# Patient Record
Sex: Female | Born: 1935 | Race: White | Hispanic: No | Smoking: Former smoker
Health system: Southern US, Community
[De-identification: ages and names within clinical notes are randomized; demographics above are authoritative.]

## PROBLEM LIST (undated history)

## (undated) DIAGNOSIS — N189 Chronic kidney disease, unspecified: Secondary | ICD-10-CM

## (undated) DIAGNOSIS — I10 Essential (primary) hypertension: Secondary | ICD-10-CM

## (undated) DIAGNOSIS — E119 Type 2 diabetes mellitus without complications: Secondary | ICD-10-CM

## (undated) DIAGNOSIS — I4891 Unspecified atrial fibrillation: Secondary | ICD-10-CM

## (undated) HISTORY — PX: TONSILLECTOMY: SUR1361

## (undated) HISTORY — PX: OTHER SURGICAL HISTORY: SHX169

---

## 2000-04-17 ENCOUNTER — Encounter: Payer: Self-pay | Admitting: Family Medicine

## 2000-04-17 ENCOUNTER — Encounter: Admission: RE | Admit: 2000-04-17 | Discharge: 2000-04-17 | Payer: Self-pay | Admitting: Family Medicine

## 2000-05-14 ENCOUNTER — Encounter: Admission: RE | Admit: 2000-05-14 | Discharge: 2000-08-12 | Payer: Self-pay | Admitting: Family Medicine

## 2005-03-23 ENCOUNTER — Encounter: Admission: RE | Admit: 2005-03-23 | Discharge: 2005-03-23 | Payer: Self-pay | Admitting: Family Medicine

## 2007-11-03 ENCOUNTER — Encounter: Admission: RE | Admit: 2007-11-03 | Discharge: 2007-11-03 | Payer: Self-pay | Admitting: Family Medicine

## 2007-11-03 IMAGING — US US CAROTID DUPLEX BILAT
1 series · 14 of 24 positions shown · non-contrast
Comparison: none

CLINICAL DATA: Unsteady gait.
 BILATERAL CAROTID DUPLEX DOPPLER ULTRASOUND:

[Series 1: us carotid duplex bilat · 0.08mm/px · 14 of 44 slices shown]
[im 1/44]
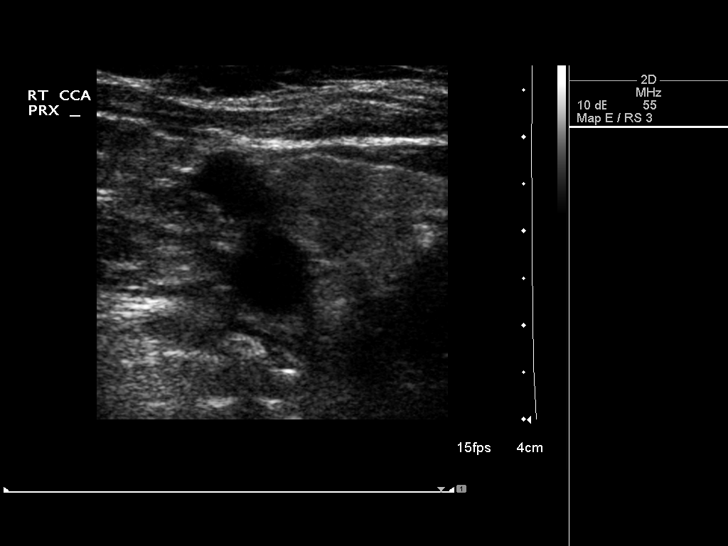
[im 4/44]
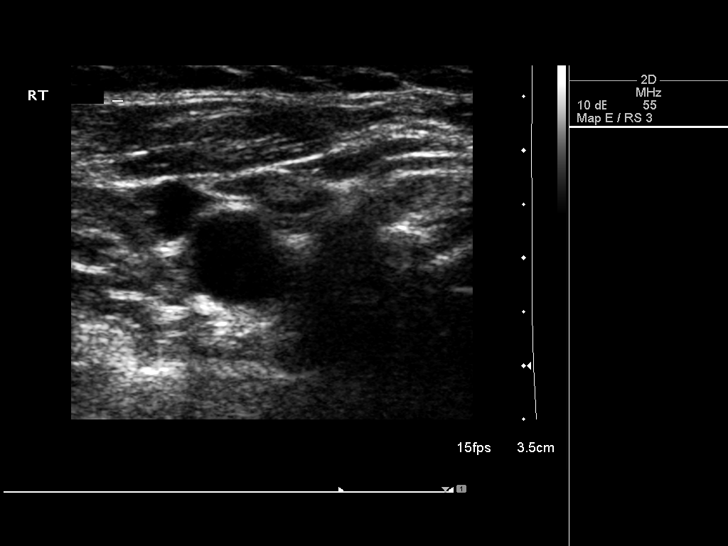
[im 8/44]
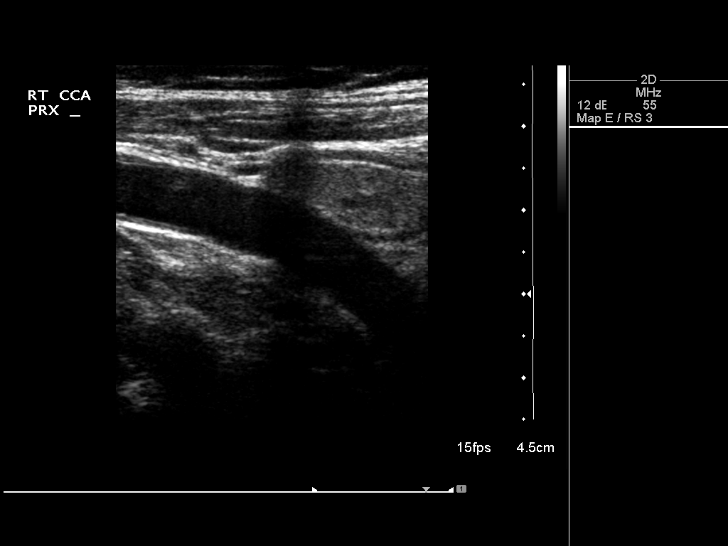
[im 12/44]
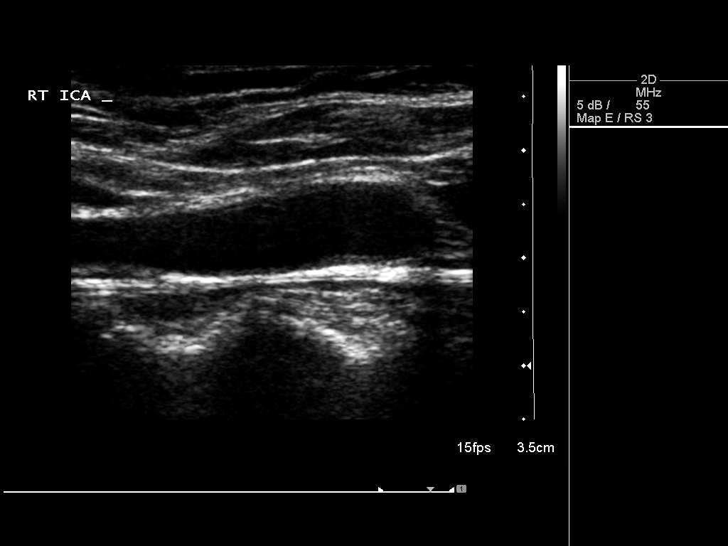
[im 14/44]
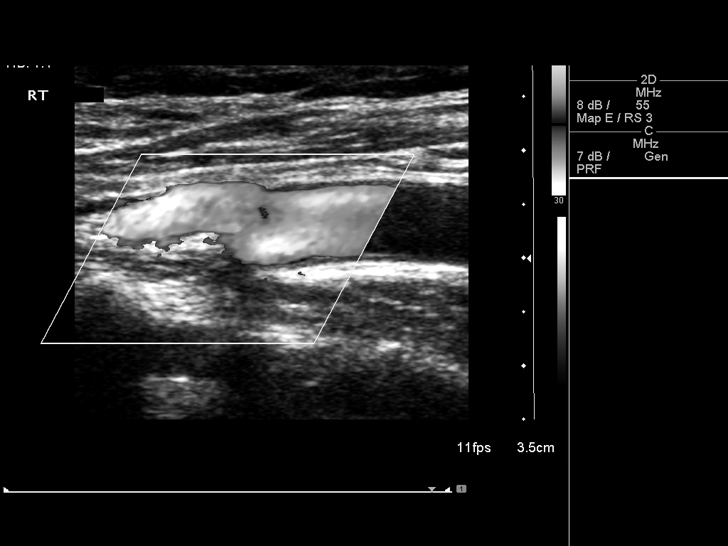
[im 17/44]
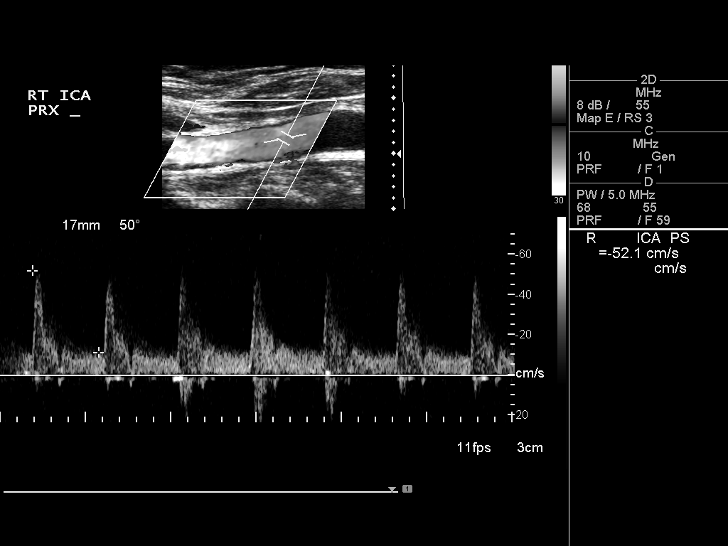
[im 21/44]
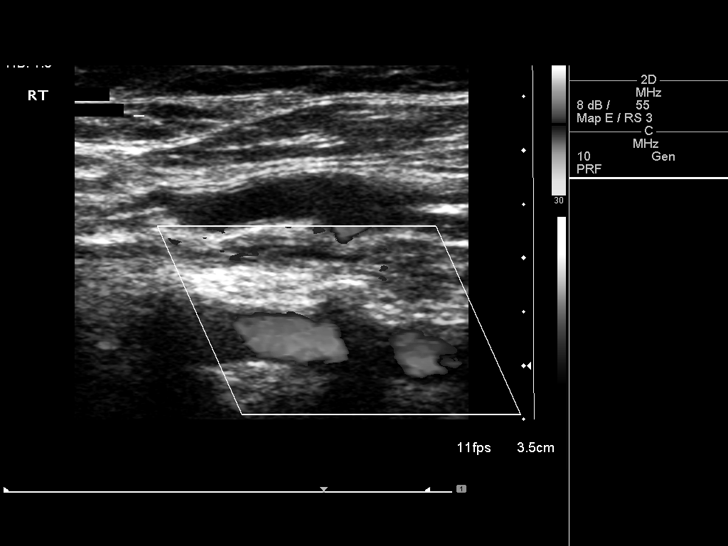
[im 23/44]
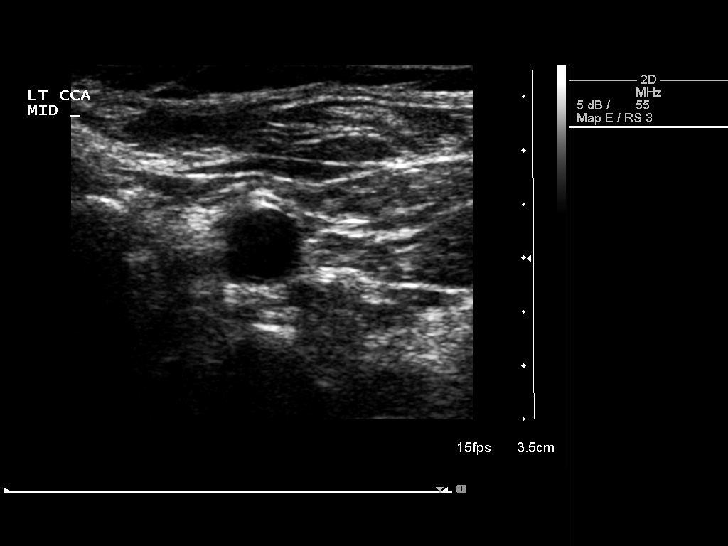
[im 27/44]
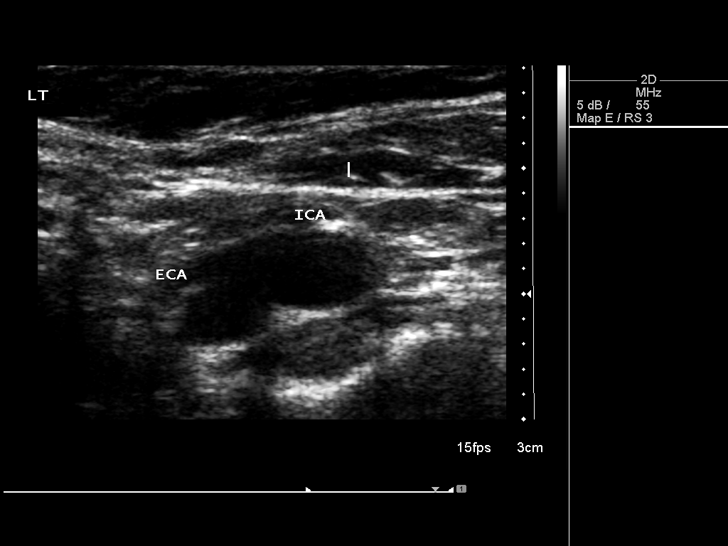
[im 30/44]
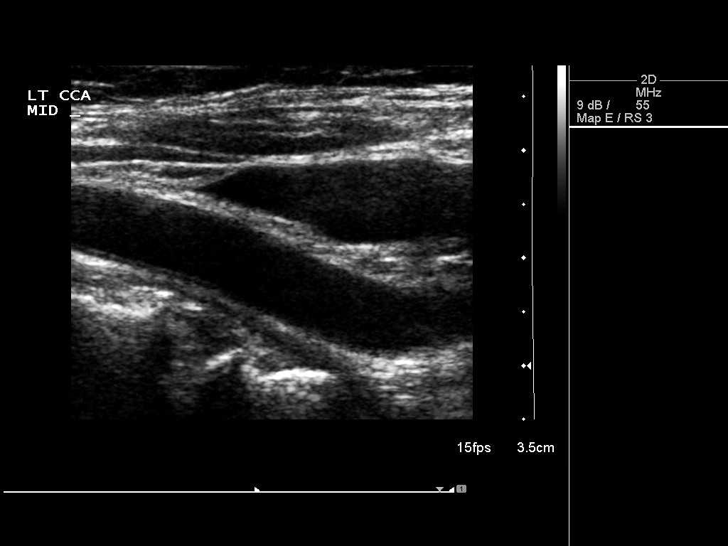
[im 34/44]
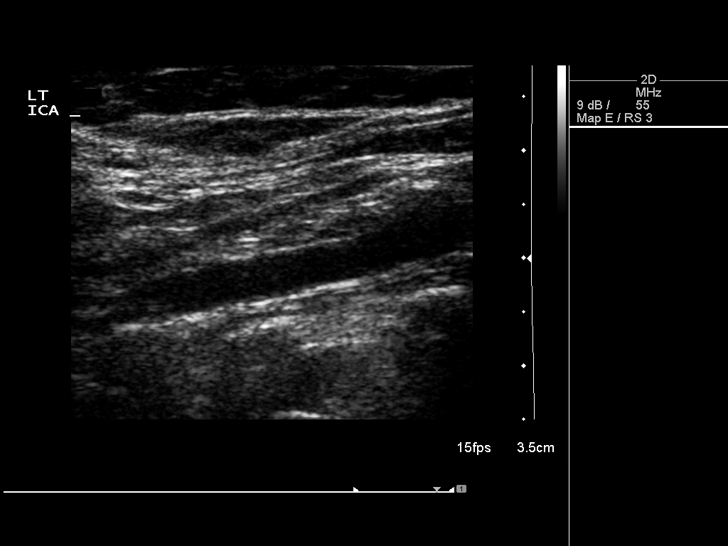
[im 36/44]
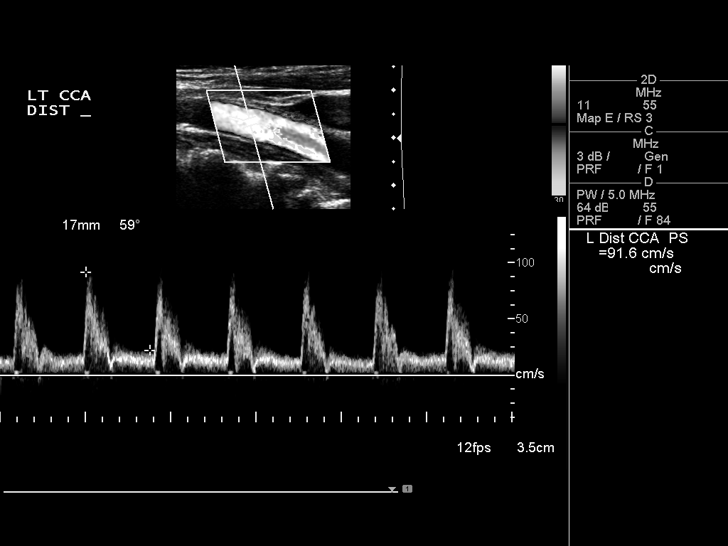
[im 40/44]
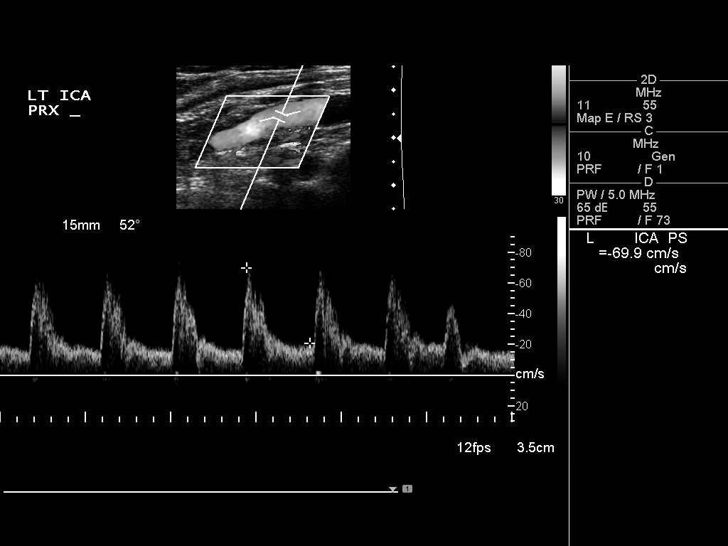
[im 44/44]
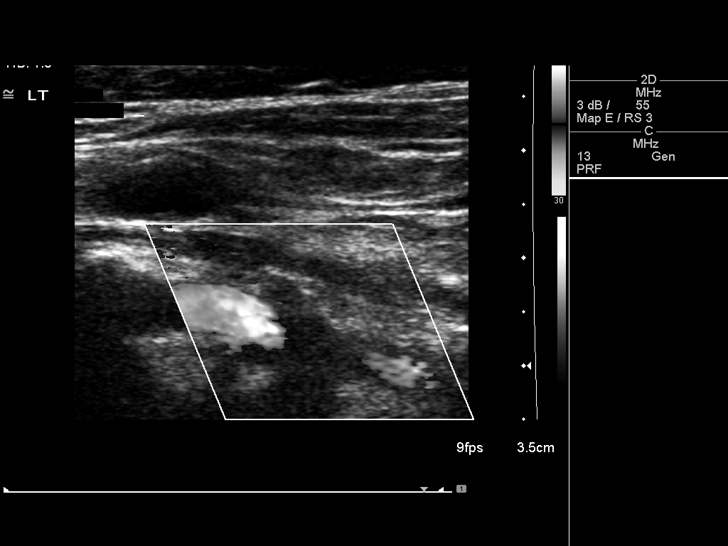

[14 of 24 positions shown; findings below may reference images not displayed]

FINDINGS: There is minimal plaque in the right ICA bulb.  Doppler analysis demonstrates a low resistance waveform and sharp upstroke. The right vertebral artery is antegrade in flow.  There is minimal calcified plaque in the left ICA bulb.  Doppler analysis demonstrates a low resistance waveform for the left ICA with sharp upstroke.  The left vertebral artery is antegrade in flow.
 The following Doppler flow velocity measurements were obtained (in cm/sec):

 SITE:  PEAK SYSTOLIC  END DIASTOLIC

 RIGHT  ICA:  82  26
 RIGHT CCA:    85  15
 RIGHT ICA/CCA RATIO:      .96
 RIGHT ECA:    93

 LEFT ICA:  102  37
 LEFT CCA:    96  18
 LEFT ICA/CCA RATIO:
 LEFT ECA:    102

 Criteria:  Quantification of carotid stenosis is based on velocity parameters that correlate the residual internal carotid diameter with NASCET-based stenosis levels.
IMPRESSION: Estimated stenosis in the right and left internal carotid arteries is 0-50% and 0-50%, respectively.

## 2012-07-09 ENCOUNTER — Ambulatory Visit
Admission: RE | Admit: 2012-07-09 | Discharge: 2012-07-09 | Disposition: A | Discharge status: Home / Self Care | Payer: Medicare Other | Source: Ambulatory Visit | Attending: Family Medicine | Admitting: Family Medicine

## 2012-07-09 ENCOUNTER — Other Ambulatory Visit: Payer: Self-pay | Admitting: Family Medicine

## 2012-07-09 DIAGNOSIS — R52 Pain, unspecified: Secondary | ICD-10-CM

## 2012-09-16 ENCOUNTER — Other Ambulatory Visit: Payer: Self-pay | Admitting: Family Medicine

## 2012-09-16 ENCOUNTER — Ambulatory Visit
Admission: RE | Admit: 2012-09-16 | Discharge: 2012-09-16 | Disposition: A | Discharge status: Home / Self Care | Payer: Medicare Other | Source: Ambulatory Visit | Attending: Family Medicine | Admitting: Family Medicine

## 2012-09-16 DIAGNOSIS — R52 Pain, unspecified: Secondary | ICD-10-CM

## 2012-09-16 DIAGNOSIS — R609 Edema, unspecified: Secondary | ICD-10-CM

## 2012-10-06 ENCOUNTER — Ambulatory Visit
Admission: RE | Admit: 2012-10-06 | Discharge: 2012-10-06 | Disposition: A | Discharge status: Home / Self Care | Payer: Medicare Other | Source: Ambulatory Visit | Attending: Family Medicine | Admitting: Family Medicine

## 2012-10-06 ENCOUNTER — Other Ambulatory Visit: Payer: Self-pay | Admitting: Family Medicine

## 2012-10-06 DIAGNOSIS — M549 Dorsalgia, unspecified: Secondary | ICD-10-CM

## 2012-10-10 ENCOUNTER — Emergency Department (HOSPITAL_COMMUNITY)
Admission: EM | Admit: 2012-10-10 | Discharge: 2012-10-10 | Disposition: A | Discharge status: Home / Self Care | Payer: Medicare Other | Attending: Emergency Medicine | Admitting: Emergency Medicine

## 2012-10-10 ENCOUNTER — Inpatient Hospital Stay (HOSPITAL_COMMUNITY)
Admission: AD | Admit: 2012-10-10 | Discharge: 2012-10-16 | DRG: 516 | Disposition: A | Discharge status: Skilled Nursing Facility | Payer: Medicare Other | Source: Ambulatory Visit | Attending: Internal Medicine | Admitting: Internal Medicine

## 2012-10-10 ENCOUNTER — Encounter (HOSPITAL_COMMUNITY): Payer: Self-pay | Admitting: *Deleted

## 2012-10-10 DIAGNOSIS — Z79899 Other long term (current) drug therapy: Secondary | ICD-10-CM

## 2012-10-10 DIAGNOSIS — M810 Age-related osteoporosis without current pathological fracture: Secondary | ICD-10-CM | POA: Diagnosis present

## 2012-10-10 DIAGNOSIS — R339 Retention of urine, unspecified: Secondary | ICD-10-CM | POA: Diagnosis present

## 2012-10-10 DIAGNOSIS — N390 Urinary tract infection, site not specified: Secondary | ICD-10-CM | POA: Diagnosis present

## 2012-10-10 DIAGNOSIS — S32009A Unspecified fracture of unspecified lumbar vertebra, initial encounter for closed fracture: Secondary | ICD-10-CM

## 2012-10-10 DIAGNOSIS — E119 Type 2 diabetes mellitus without complications: Secondary | ICD-10-CM | POA: Insufficient documentation

## 2012-10-10 DIAGNOSIS — M549 Dorsalgia, unspecified: Secondary | ICD-10-CM

## 2012-10-10 DIAGNOSIS — W19XXXA Unspecified fall, initial encounter: Secondary | ICD-10-CM | POA: Diagnosis present

## 2012-10-10 DIAGNOSIS — M545 Low back pain, unspecified: Secondary | ICD-10-CM | POA: Insufficient documentation

## 2012-10-10 DIAGNOSIS — N189 Chronic kidney disease, unspecified: Secondary | ICD-10-CM | POA: Diagnosis present

## 2012-10-10 DIAGNOSIS — I129 Hypertensive chronic kidney disease with stage 1 through stage 4 chronic kidney disease, or unspecified chronic kidney disease: Secondary | ICD-10-CM | POA: Insufficient documentation

## 2012-10-10 DIAGNOSIS — G8929 Other chronic pain: Secondary | ICD-10-CM | POA: Diagnosis present

## 2012-10-10 DIAGNOSIS — M8448XA Pathological fracture, other site, initial encounter for fracture: Principal | ICD-10-CM | POA: Diagnosis present

## 2012-10-10 DIAGNOSIS — E871 Hypo-osmolality and hyponatremia: Secondary | ICD-10-CM | POA: Diagnosis present

## 2012-10-10 DIAGNOSIS — S32000A Wedge compression fracture of unspecified lumbar vertebra, initial encounter for closed fracture: Secondary | ICD-10-CM

## 2012-10-10 DIAGNOSIS — E860 Dehydration: Secondary | ICD-10-CM

## 2012-10-10 HISTORY — DX: Type 2 diabetes mellitus without complications: E11.9

## 2012-10-10 HISTORY — DX: Chronic kidney disease, unspecified: N18.9

## 2012-10-10 HISTORY — DX: Essential (primary) hypertension: I10

## 2012-10-10 LAB — CBC
Hemoglobin: 10.8 g/dL — ABNORMAL LOW (ref 12.0–15.0)
MCH: 28.5 pg (ref 26.0–34.0)
MCV: 85.5 fL (ref 78.0–100.0)
Platelets: 361 10*3/uL (ref 150–400)
RBC: 3.79 MIL/uL — ABNORMAL LOW (ref 3.87–5.11)
WBC: 9.4 10*3/uL (ref 4.0–10.5)

## 2012-10-10 LAB — BASIC METABOLIC PANEL
CO2: 28 mEq/L (ref 19–32)
Calcium: 9.6 mg/dL (ref 8.4–10.5)
Chloride: 86 mEq/L — ABNORMAL LOW (ref 96–112)
Creatinine, Ser: 0.68 mg/dL (ref 0.50–1.10)
Glucose, Bld: 106 mg/dL — ABNORMAL HIGH (ref 70–99)
Sodium: 123 mEq/L — ABNORMAL LOW (ref 135–145)

## 2012-10-10 LAB — URINALYSIS, ROUTINE W REFLEX MICROSCOPIC
Glucose, UA: NEGATIVE mg/dL
Hgb urine dipstick: NEGATIVE
Leukocytes, UA: NEGATIVE
Protein, ur: 100 mg/dL — AB
pH: 6 (ref 5.0–8.0)

## 2012-10-10 LAB — GLUCOSE, CAPILLARY: Glucose-Capillary: 97 mg/dL (ref 70–99)

## 2012-10-10 LAB — URINE MICROSCOPIC-ADD ON

## 2012-10-10 MED ORDER — INSULIN ASPART 100 UNIT/ML ~~LOC~~ SOLN
4.0000 [IU] | Freq: Three times a day (TID) | SUBCUTANEOUS | Status: DC
  Administered 2012-10-12 – 2012-10-16 (×8): 4 [IU] via SUBCUTANEOUS

## 2012-10-10 MED ORDER — TIMOLOL MALEATE 0.5 % OP SOLG
1.0000 [drp] | Freq: Every day | OPHTHALMIC | Status: DC
  Administered 2012-10-10 – 2012-10-15 (×6): 1 [drp] via OPHTHALMIC
  Filled 2012-10-10 (×2): qty 5

## 2012-10-10 MED ORDER — ADULT MULTIVITAMIN W/MINERALS CH
1.0000 | ORAL_TABLET | Freq: Every day | ORAL | Status: DC
  Administered 2012-10-11 – 2012-10-16 (×6): 1 via ORAL
  Filled 2012-10-10 (×6): qty 1

## 2012-10-10 MED ORDER — LORAZEPAM 0.5 MG PO TABS
0.5000 mg | ORAL_TABLET | Freq: Once | ORAL | Status: AC
  Administered 2012-10-10: 0.5 mg via ORAL
  Filled 2012-10-10: qty 1

## 2012-10-10 MED ORDER — VITAMIN D3 25 MCG (1000 UNIT) PO TABS
1000.0000 [IU] | ORAL_TABLET | Freq: Every day | ORAL | Status: DC
  Administered 2012-10-11: 10:00:00 via ORAL
  Administered 2012-10-12 – 2012-10-15 (×4): 1000 [IU] via ORAL
  Filled 2012-10-10 (×6): qty 1

## 2012-10-10 MED ORDER — ACETAMINOPHEN 650 MG RE SUPP: 650.0000 mg | Freq: Four times a day (QID) | RECTAL | Status: DC | PRN

## 2012-10-10 MED ORDER — DIAZEPAM 5 MG PO TABS: 2.5000 mg | ORAL_TABLET | Freq: Three times a day (TID) | ORAL | Status: DC | PRN

## 2012-10-10 MED ORDER — HYDROCHLOROTHIAZIDE 25 MG PO TABS
25.0000 mg | ORAL_TABLET | Freq: Every day | ORAL | Status: DC
  Administered 2012-10-11 – 2012-10-15 (×5): 25 mg via ORAL
  Filled 2012-10-10 (×5): qty 1

## 2012-10-10 MED ORDER — FENTANYL CITRATE 0.05 MG/ML IJ SOLN
75.0000 ug | Freq: Once | INTRAMUSCULAR | Status: AC
  Administered 2012-10-10: 75 ug via INTRAVENOUS
  Filled 2012-10-10: qty 2

## 2012-10-10 MED ORDER — LOSARTAN POTASSIUM 50 MG PO TABS
100.0000 mg | ORAL_TABLET | Freq: Every morning | ORAL | Status: DC
  Administered 2012-10-11 – 2012-10-16 (×6): 100 mg via ORAL
  Filled 2012-10-10 (×6): qty 2

## 2012-10-10 MED ORDER — ATENOLOL 50 MG PO TABS
50.0000 mg | ORAL_TABLET | Freq: Every day | ORAL | Status: DC
  Administered 2012-10-11 – 2012-10-16 (×6): 50 mg via ORAL
  Filled 2012-10-10 (×6): qty 1

## 2012-10-10 MED ORDER — CALCIUM CARBONATE 1250 (500 CA) MG PO TABS
1.0000 | ORAL_TABLET | Freq: Every day | ORAL | Status: DC
  Administered 2012-10-11: 200 mg via ORAL
  Administered 2012-10-12 – 2012-10-15 (×4): 500 mg via ORAL
  Filled 2012-10-10 (×6): qty 1

## 2012-10-10 MED ORDER — MORPHINE SULFATE 2 MG/ML IJ SOLN
0.5000 mg | INTRAMUSCULAR | Status: DC | PRN
  Administered 2012-10-10 – 2012-10-11 (×5): 1 mg via INTRAVENOUS
  Filled 2012-10-10 (×5): qty 1

## 2012-10-10 MED ORDER — CALCIUM CARBONATE 600 MG PO TABS: 600.0000 mg | ORAL_TABLET | Freq: Every day | ORAL | Status: DC

## 2012-10-10 MED ORDER — B COMPLEX-C PO TABS
1.0000 | ORAL_TABLET | Freq: Every day | ORAL | Status: DC
  Administered 2012-10-11 – 2012-10-16 (×6): 1 via ORAL
  Filled 2012-10-10 (×6): qty 1

## 2012-10-10 MED ORDER — ACETAMINOPHEN 325 MG PO TABS
650.0000 mg | ORAL_TABLET | Freq: Four times a day (QID) | ORAL | Status: DC | PRN
  Administered 2012-10-11: 650 mg via ORAL
  Filled 2012-10-10: qty 2

## 2012-10-10 MED ORDER — SIMVASTATIN 5 MG PO TABS
5.0000 mg | ORAL_TABLET | Freq: Every day | ORAL | Status: DC
  Administered 2012-10-10 – 2012-10-15 (×5): 5 mg via ORAL
  Filled 2012-10-10 (×7): qty 1

## 2012-10-10 MED ORDER — ATENOLOL-CHLORTHALIDONE 50-25 MG PO TABS: 1.0000 | ORAL_TABLET | Freq: Every morning | ORAL | Status: DC

## 2012-10-10 MED ORDER — INSULIN ASPART 100 UNIT/ML ~~LOC~~ SOLN
0.0000 [IU] | Freq: Three times a day (TID) | SUBCUTANEOUS | Status: DC
  Administered 2012-10-11 – 2012-10-12 (×2): 2 [IU] via SUBCUTANEOUS
  Administered 2012-10-13: 8 [IU] via SUBCUTANEOUS
  Administered 2012-10-13 – 2012-10-14 (×2): 3 [IU] via SUBCUTANEOUS
  Administered 2012-10-15 (×2): 2 [IU] via SUBCUTANEOUS
  Administered 2012-10-15: 3 [IU] via SUBCUTANEOUS
  Administered 2012-10-16: 5 [IU] via SUBCUTANEOUS

## 2012-10-10 MED ORDER — ONDANSETRON HCL 4 MG/2ML IJ SOLN: 4.0000 mg | Freq: Four times a day (QID) | INTRAMUSCULAR | Status: DC | PRN

## 2012-10-10 MED ORDER — ENOXAPARIN SODIUM 40 MG/0.4ML ~~LOC~~ SOLN
40.0000 mg | SUBCUTANEOUS | Status: DC
  Administered 2012-10-10 – 2012-10-12 (×3): 40 mg via SUBCUTANEOUS
  Filled 2012-10-10 (×4): qty 0.4

## 2012-10-10 MED ORDER — SODIUM CHLORIDE 0.9 % IV SOLN
INTRAVENOUS | Status: DC
  Administered 2012-10-10 (×2): via INTRAVENOUS
  Administered 2012-10-11: 75 mL/h via INTRAVENOUS

## 2012-10-10 MED ORDER — SENNOSIDES-DOCUSATE SODIUM 8.6-50 MG PO TABS
1.0000 | ORAL_TABLET | Freq: Every evening | ORAL | Status: DC | PRN
  Administered 2012-10-11 – 2012-10-12 (×2): 1 via ORAL
  Filled 2012-10-10 (×2): qty 1

## 2012-10-10 MED ORDER — ONDANSETRON HCL 4 MG PO TABS: 4.0000 mg | ORAL_TABLET | Freq: Four times a day (QID) | ORAL | Status: DC | PRN

## 2012-10-10 NOTE — ED Notes (Signed)
In July pt sustained L1/L2 compression fracture, a couple of weeks ago a new fracture was found. Lidoderm patches not helping. Pt also prescribed Vicodin and since then has had itching in her hands and drowsy, pain is persiting and unable to sleep. Pt lives at home.

## 2012-10-10 NOTE — ED Notes (Signed)
[id number]<AV> Expected date:<BR> Expected time:<BR> Means of arrival:<BR> Comments:<BR> EMS/Elderly female/L1-L2 compression fx/Pain

## 2012-10-10 NOTE — ED Notes (Signed)
Lynden Ang (pt neighbor) [phone]

## 2012-10-10 NOTE — H&P (Signed)
[facility]          History and Physical    PCP:   Lupita Raider, MD   Chief Complaint:  Back Pain, fall, difficulty urinating.  HPI: Natalie Hicks 78 y/o white woman with PMH significant for DM, CKD (?stage) and osteoporosis. In July she spontaneously had back pain and was found to have an L1 compression fracture. Was treated with PO pain meds and eventually subsided. About 3 days ago she bent to get some clothes out of the washing machine and suddenly had severe back pain. She tried to manage this at home with vicodin, but her PCP and a neighbor, who is a Engineer, civil (consulting), were very concerned because she was getting very somnolent. Early today she came to the ED for evaluation of the pain and on xray was found to have a new L2 compression fracture. Per patient report (notes from the ED are not available) she was given ?valium? And sent home. Upon arrival to home, she went to the bathroom and suffered a fall. She lives by herself and it took her a long time to get up and reach the phone. She called her PCP who suggested she come to the hospital for further evaluation and management. She is having unbearable back pain at present. She also has not been able to urinate in 12 hours. Bladder scan shows >600 cc of retained urine.  Allergies:   Allergies  Allergen Reactions  . Actos (Pioglitazone) Anaphylaxis  . Avandia (Rosiglitazone) Shortness Of Breath  . Aspirin Other (See Comments)    Reaction: cramping and GI upset  . Celecoxib Other (See Comments)    Reaction: GI upset  . Penicillins Hives      Past Medical History  Diagnosis Date  . Diabetes   . Chronic kidney disease   . Hypertension     Past Surgical History  Procedure Date  . Tonsillectomy   . Hemrhoidectomy     Prior to Admission medications   Medication Sig Start Date End Date Taking? Authorizing Provider  atenolol-chlorthalidone (TENORETIC) 50-25 MG per tablet Take 1 tablet by mouth every morning.   Yes  Historical Provider, MD  B Complex-C (B-COMPLEX WITH VITAMIN C) tablet Take 1 tablet by mouth daily.   Yes Historical Provider, MD  calcium carbonate (OS-CAL) 600 MG TABS Take 600 mg by mouth daily after lunch.   Yes Historical Provider, MD  cholecalciferol (VITAMIN D) 1000 UNITS tablet Take 1,000 Units by mouth daily after lunch.   Yes Historical Provider, MD  glyBURIDE-metformin (GLUCOVANCE) 2.5-500 MG per tablet Take 2 tablets by mouth 2 (two) times daily with a meal.   Yes Historical Provider, MD  losartan (COZAAR) 100 MG tablet Take 100 mg by mouth every morning.   Yes Historical Provider, MD  Multiple Vitamin (MULTIVITAMIN WITH MINERALS) TABS Take 1 tablet by mouth daily.   Yes Historical Provider, MD  pravastatin (PRAVACHOL) 20 MG tablet Take 20 mg by mouth at bedtime.   Yes Historical Provider, MD  timolol (TIMOPTIC-XR) 0.5 % ophthalmic gel-forming Place 1 drop into both eyes at bedtime.   Yes Historical Provider, MD  diazepam (VALIUM) 5 MG tablet Take 0.5 tablets (2.5 mg total) by mouth every 8 (eight) hours as needed for anxiety. 10/10/12   Raeford Razor, MD    Social History:  reports that she has never smoked. She does not have any smokeless tobacco history on file. She reports that she does not drink alcohol or use illicit drugs.  No family history on  file.  Review of Systems:  Constitutional: Denies fever, chills, diaphoresis, appetite change. HEENT: Denies photophobia, eye pain, redness, hearing loss, ear pain, congestion, sore throat, rhinorrhea, sneezing, mouth sores, trouble swallowing, neck pain, neck stiffness and tinnitus.   Respiratory: Denies SOB, DOE, cough, chest tightness,  and wheezing.   Cardiovascular: Denies chest pain, palpitations and leg swelling.  Gastrointestinal: Denies nausea, vomiting, abdominal pain, diarrhea, constipation, blood in stool and abdominal distention.  Genitourinary: Denies dysuria, urgency, frequency, hematuria, flank pain and difficulty  urinating.  Musculoskeletal: Denies myalgias, joint swelling, arthralgias and gait problem.  Skin: Denies pallor, rash and wound.  Neurological: Denies dizziness, seizures, syncope, weakness, light-headedness, numbness and headaches.  Hematological: Denies adenopathy. Easy bruising, personal or family bleeding history  Psychiatric/Behavioral: Denies suicidal ideation, mood changes, confusion, nervousness, sleep disturbance and agitation   Physical Exam: Blood pressure 168/54, pulse 80, temperature 97.7 F (36.5 C), temperature source Oral, resp. rate 20, SpO2 94.00%. Gen: AA Ox3. HEENT: [place]/AT/PERRL/EOMI/dry mucous membranes. Neck: supple, no JVD, no LAD, no bruits, no goiter. CV: RRR, no M/R/G Lungs: CTA B Abd: S, TTP to suprapubic area, +BS, appears to have bladder distention on exam. Ext: 1+ edema Bilaterally. Has redness to the shins bilaterally, which per patient report is chronic. Neuro: Appears grossly intact and non-focal, although I have not ambulated her.  Labs on Admission:  No results found for this or any previous visit (from the past 48 hour(s)).  Radiological Exams on Admission: No results found.  Assessment/Plan Principal Problem:  *Lumbar compression fracture Active Problems:  Back pain  Urinary retention  DM (diabetes mellitus)  Dehydration   Back Pain -2/2 new L2 compression fracture. -Has not been able to tolerate PO pain meds. -Has been admitted for IV pain control. -Will get PT/OT evals. -She is willing to go to rehab if indicated.  Fall -Likely from somnolence from pain meds + pain from compression fracture. -Await PT eval.  Urinary Retention -Probably from pain meds. -Will do in and out cath for now. -May need foley if persists. -Will also check UA with culture to make sure she does not have a UTI (which can also cause urinary retention).  Dehydration -She does appear clinically mildly dehydrated with dry tongue and lips. -Will provide  gentle IVF as I have no idea of her EF.  DM -Check A1c. -Hold metformin. -SSI while in the hospital.  CKD -?Stage. -Await labs.  DVT Prophylaxis -Lovenox.   Time Spent on Admission: 70 minutes  HERNANDEZ ACOSTA,ESTELA [facility] Pager: [phone] 10/10/2012, 5:35 PM

## 2012-10-10 NOTE — ED Notes (Signed)
Pt transported via [facility] home  

## 2012-10-11 ENCOUNTER — Inpatient Hospital Stay (HOSPITAL_COMMUNITY): Payer: Medicare Other

## 2012-10-11 DIAGNOSIS — E871 Hypo-osmolality and hyponatremia: Secondary | ICD-10-CM

## 2012-10-11 LAB — COMPREHENSIVE METABOLIC PANEL
BUN: 20 mg/dL (ref 6–23)
CO2: 28 mEq/L (ref 19–32)
Calcium: 8.9 mg/dL (ref 8.4–10.5)
Chloride: 91 mEq/L — ABNORMAL LOW (ref 96–112)
Creatinine, Ser: 0.66 mg/dL (ref 0.50–1.10)
GFR calc Af Amer: >90 mL/min (ref >90)
GFR calc non Af Amer: 78 mL/min — ABNORMAL LOW (ref >90)
Glucose, Bld: 71 mg/dL (ref 70–99)
Total Bilirubin: 0.3 mg/dL (ref 0.3–1.2)

## 2012-10-11 LAB — GLUCOSE, CAPILLARY: Glucose-Capillary: 132 mg/dL — ABNORMAL HIGH (ref 70–99)

## 2012-10-11 LAB — HEMOGLOBIN A1C: Mean Plasma Glucose: 160 mg/dL — ABNORMAL HIGH (ref <117)

## 2012-10-11 MED ORDER — POLYETHYLENE GLYCOL 3350 17 G PO PACK
17.0000 g | PACK | Freq: Every day | ORAL | Status: DC
  Administered 2012-10-11 – 2012-10-13 (×3): 17 g via ORAL
  Filled 2012-10-11 (×6): qty 1

## 2012-10-11 MED ORDER — LORAZEPAM 2 MG/ML IJ SOLN
2.0000 mg | Freq: Once | INTRAMUSCULAR | Status: AC
  Administered 2012-10-12: 2 mg via INTRAVENOUS
  Filled 2012-10-11: qty 1

## 2012-10-11 MED ORDER — MORPHINE SULFATE 2 MG/ML IJ SOLN
0.5000 mg | INTRAMUSCULAR | Status: DC | PRN
  Administered 2012-10-11: 1 mg via INTRAVENOUS
  Administered 2012-10-11: 0.5 mg via INTRAVENOUS
  Administered 2012-10-11: 1 mg via INTRAVENOUS
  Administered 2012-10-11: 0.5 mg via INTRAVENOUS
  Administered 2012-10-12 – 2012-10-15 (×7): 1 mg via INTRAVENOUS
  Filled 2012-10-11 (×8): qty 1

## 2012-10-11 MED ORDER — SODIUM CHLORIDE 0.9 % IV SOLN
INTRAVENOUS | Status: DC
  Administered 2012-10-11: 1000 mL via INTRAVENOUS
  Administered 2012-10-12 – 2012-10-13 (×3): via INTRAVENOUS
  Administered 2012-10-14: 1000 mL via INTRAVENOUS
  Administered 2012-10-15 (×2): via INTRAVENOUS

## 2012-10-11 MED ORDER — GUAIFENESIN 100 MG/5ML PO SOLN: 5.0000 mL | ORAL | Status: DC | PRN

## 2012-10-11 MED ORDER — MORPHINE SULFATE 2 MG/ML IJ SOLN
1.0000 mg | Freq: Once | INTRAMUSCULAR | Status: AC
  Administered 2012-10-12: 1 mg via INTRAVENOUS
  Filled 2012-10-11 (×4): qty 1

## 2012-10-11 MED ORDER — GUAIFENESIN ER 600 MG PO TB12
600.0000 mg | ORAL_TABLET | Freq: Two times a day (BID) | ORAL | Status: DC | PRN
  Administered 2012-10-11 – 2012-10-14 (×3): 600 mg via ORAL
  Filled 2012-10-11 (×4): qty 1

## 2012-10-11 NOTE — Progress Notes (Signed)
Utilization review completed.  

## 2012-10-11 NOTE — Evaluation (Signed)
Physical Therapy Evaluation Patient Details Name: KARYSS FRESE MRN: [id number] DOB: 10-21-1925 Today's Date: 10/11/2012 Time: 4098-1191 PT Time Calculation (min): 25 min  PT Assessment / Plan / Recommendation Clinical Impression  Pt s/p fall at home and increased back pain with L1 and L2 compression fxs.  Pt would benefit from acute PT services in order to improve independence with transfers and ambulation as well as decrease pain to prepare for d/c to next venue.  Pt with increased pain, so only sat EOB.  Did not attempt standing due to family in room report possible feet xray (bruising present on toe).  Also educated on back precautions to assist with pain control.    PT Assessment  Patient needs continued PT services    Follow Up Recommendations  Post acute inpatient    Does the patient have the potential to tolerate intense rehabilitation   No, Recommend SNF  Barriers to Discharge        Equipment Recommendations  Rolling walker with 5" wheels;3 in 1 bedside comode    Recommendations for Other Services     Frequency Min 3X/week    Precautions / Restrictions Precautions Precautions: Fall   Pertinent Vitals/Pain 5/10 back pain, increased to 10/10 with mobility then 6/10 upon repositioning in supine, premedicated       Mobility  Bed Mobility Bed Mobility: Rolling Left;Left Sidelying to Sit;Scooting to [facility];Sit to Sidelying Left Rolling Left: 1: +2 Total assist Rolling Left: Patient Percentage: 10% Left Sidelying to Sit: 1: +2 Total assist Left Sidelying to Sit: Patient Percentage: 10% Sit to Sidelying Left: 1: +2 Total assist Sit to Sidelying Left: Patient Percentage: 10% Scooting to HOB: 1: +2 Total assist Scooting to [facility]: Patient Percentage: 0% Details for Bed Mobility Assistance: pt educated on log roll technique to decrease pain with transfers, increased assist due to pain, increased time to perform Transfers Transfers: Not  assessed Ambulation/Gait Ambulation/Gait Assistance: Not tested (comment)    Shoulder Instructions     Exercises     PT Diagnosis: Difficulty walking;Acute pain  PT Problem List: Decreased strength;Decreased activity tolerance;Decreased mobility;Decreased knowledge of use of DME;Pain;Obesity PT Treatment Interventions: DME instruction;Gait training;Functional mobility training;Therapeutic activities;Therapeutic exercise;Patient/family education   PT Goals Acute Rehab PT Goals PT Goal Formulation: With patient Time For Goal Achievement: 10/25/12 Potential to Achieve Goals: Fair Pt will Roll Supine to Left Side: with min assist PT Goal: Rolling Supine to Left Side - Progress: Goal set today Pt will go Supine/Side to Sit: with min assist PT Goal: Supine/Side to Sit - Progress: Goal set today Pt will go Sit to Supine/Side: with min assist PT Goal: Sit to Supine/Side - Progress: Goal set today Pt will go Sit to Stand: with min assist PT Goal: Sit to Stand - Progress: Goal set today Pt will go Stand to Sit: with min assist PT Goal: Stand to Sit - Progress: Goal set today Pt will Ambulate: 16 - 50 feet;with mod assist;with least restrictive assistive device PT Goal: Ambulate - Progress: Goal set today  Visit Information  Last PT Received On: 10/11/12 Assistance Needed: +2    Subjective Data  Subjective: I know I need help to move.   Prior Functioning  Home Living Lives With: Alone Home Adaptive Equipment: Straight cane Prior Function Level of Independence: Independent with assistive device(s) Comments: Pt reports was ambulating with SPC however more recently unable to transfer without assistance due to pain Communication Communication: No difficulties    Cognition  Overall Cognitive Status: Appears within  functional limits for tasks assessed/performed Arousal/Alertness: Awake/alert Orientation Level: Appears intact for tasks assessed Behavior During Session: [facility] for tasks  performed    Extremity/Trunk Assessment Right Lower Extremity Assessment RLE ROM/Strength/Tone: Unable to fully assess;Due to pain (able to perform ankle pumps but reports pain on R ankle) RLE Sensation: WFL - Light Touch (per pt report to light touch on lower leg) Left Lower Extremity Assessment LLE ROM/Strength/Tone: Unable to fully assess;Due to pain LLE Sensation: WFL - Light Touch   Balance Balance Balance Assessed: Yes Static Sitting Balance Static Sitting - Balance Support: Bilateral upper extremity supported;Feet unsupported Static Sitting - Level of Assistance: 5: Stand by assistance Static Sitting - Comment/# of Minutes: once pain decreased, pt able to sit EOB with supervision, unable to tolerate sitting EOB for more than 3 minutes however  End of Session PT - End of Session Activity Tolerance: Patient limited by pain Patient left: in bed;with call bell/phone within reach;with family/visitor present  GP     Kolt Mcwhirter,KATHrine E 10/11/2012, 9:44 AM Pager: [phone]

## 2012-10-11 NOTE — Progress Notes (Signed)
Pt attempted to void using bedpan, but was unable. Pt states she felt the urge to go, but still was unable.Bladder scan showed 309cc of urine. Foley catheter inserted, pt tolerated procedure well. Clear yellow urine returned.

## 2012-10-11 NOTE — Progress Notes (Signed)
[facility]             Progress Note   Subjective: Excruciating back pain that is now radiating down both legs to the anterior part of both ankles.   Objective: Vital signs in last 24 hours: Temp:  [97.7 F (36.5 C)-98.2 F (36.8 C)] 98.2 F (36.8 C) (10/12 0702) Pulse Rate:  [70-80] 70  (10/12 0702) Resp:  [20] 20  (10/12 0702) BP: (128-168)/(49-60) 146/49 mmHg (10/12 0702) SpO2:  [94 %-96 %] 96 % (10/12 0702) Weight:  [84.9 kg (187 lb 2.7 oz)] 84.9 kg (187 lb 2.7 oz) (10/11 1809) Weight change:  Last BM Date: 10/10/12  Intake/Output from previous day: 10/11 0701 - 10/12 0700 In: 562.5 [P.O.:240; I.V.:322.5] Out: 1625 [Urine:1625] Total I/O In: 240 [P.O.:240] Out: -    Physical Exam: General: Alert, awake, oriented x3, in no acute distress. HEENT: No bruits, no goiter. Heart: Regular rate and rhythm, without murmurs, rubs, gallops. Lungs: Clear to auscultation bilaterally. Abdomen: Soft, nontender, nondistended, positive bowel sounds. Extremities: No clubbing cyanosis or edema with positive pedal pulses. Neuro: Grossly intact, nonfocal.    Lab Results: Basic Metabolic Panel:  Basename 10/11/12 0358 10/10/12 1735  NA 127* 123*  K 3.7 3.9  CL 91* 86*  CO2 28 28  GLUCOSE 71 106*  BUN 20 24*  CREATININE 0.66 0.68  CALCIUM 8.9 9.6  MG -- --  PHOS -- --   Liver Function Tests:  [facility] 10/11/12 0358  AST 17  ALT 7  ALKPHOS 46  BILITOT 0.3  PROT 5.8*  ALBUMIN 2.6*   CBC:  Basename 10/10/12 1735  WBC 9.4  NEUTROABS --  HGB 10.8*  HCT 32.4*  MCV 85.5  PLT 361   CBG:  Basename 10/11/12 0751 10/10/12 2126 10/10/12 1737  GLUCAP 70 110* 97   Hemoglobin A1C:  Basename 10/10/12 1735  HGBA1C 7.2*   Urinalysis:  Basename 10/10/12 1719  COLORURINE YELLOW  LABSPEC 1.015  PHURINE 6.0  GLUCOSEU NEGATIVE  HGBUR NEGATIVE  BILIRUBINUR NEGATIVE  KETONESUR TRACE*  PROTEINUR 100*  UROBILINOGEN 0.2  NITRITE NEGATIVE    LEUKOCYTESUR NEGATIVE    Studies/Results: Dg Foot Complete Left  10/11/2012  *RADIOLOGY REPORT*  Clinical Data: Bruised and swollen left fourth and fifth toes after a fall.  LEFT FOOT - COMPLETE 3+ VIEW  Comparison: No priors.  Findings: There is a mild irregularity of the trabecular markings in the base of the fifth proximal phalanx, which could suggest the presence of a nondisplaced fracture.  No other definite displaced fracture or dislocation is noted.  There are multifocal degenerative changes of osteoarthritis with joint space narrowing, subchondral sclerosis, subchondral cyst formation and osteophyte formation throughout the DIP, PIP joints and midfoot.  Similar changes are also noted at the second MTP joint.  IMPRESSION: 1.  Possible nondisplaced fracture through the base of the fifth proximal phalanx. 2.  Advanced degenerative changes of osteoarthritis.   Original Report Authenticated By: Florencia Reasons, M.D.     Medications: Scheduled Meds:   . atenolol  50 mg Oral Daily   And  . hydrochlorothiazide  25 mg Oral Daily  . B-complex with vitamin C  1 tablet Oral Daily  . calcium carbonate  1 tablet Oral Q1200  . cholecalciferol  1,000 Units Oral Q1200  . enoxaparin (LOVENOX) injection  40 mg Subcutaneous Q24H  . insulin aspart  0-15 Units Subcutaneous TID WC  . insulin aspart  4 Units Subcutaneous TID WC  . LORazepam  2 mg Intravenous Once  . losartan  100 mg Oral q morning - 10a  .  morphine injection  1 mg Intravenous Once  . multivitamin with minerals  1 tablet Oral Daily  . polyethylene glycol  17 g Oral Daily  . simvastatin  5 mg Oral q1800  . timolol  1 drop Both Eyes QHS  . DISCONTD: atenolol-chlorthalidone  1 tablet Oral q morning - 10a  . DISCONTD: calcium carbonate  600 mg Oral QPC lunch   Continuous Infusions:   . sodium chloride 75 mL/hr (10/11/12 0950)   PRN Meds:.acetaminophen, acetaminophen, morphine injection, ondansetron (ZOFRAN) IV, ondansetron,  senna-docusate, DISCONTD:  morphine injection  Assessment/Plan:  Principal Problem:  *Lumbar compression fracture Active Problems:  Back pain  Urinary retention  DM (diabetes mellitus)  Dehydration  CKD (chronic kidney disease)  Hyponatremia   Back Pain -2/2 lumbar compression fractures. -With radicular pain and urinary retention, feel we need to rule out acute cord issues. -MRI L-Spine has been ordered. -Will adjust pain regimen to provide better relief. -Depending on results of MRI may need to consider kyphoplasty for pain relief.  Fall -Suspect related to pain from compression fracture and somnolence from pain meds. -PT is recommending SNF at DC.  Urinary Retention -Has required placement of a foley. -Most likely 2/2 pain meds. -Feel we need to rule out acute cord issues (see above). -UA negative for UTI.  Hyponatremia -Continue IVF. -Recheck BMET in am. -Improved to 127 from 123 on admission. -Suspect related to mild dehydration and decreased PO intake.  DM -CBGs well controlled.  DVT Prophylaxis -Lovenox.   Time spent coordinating care: 35 minutes.   LOS: 1 day   [facility] Pager: [phone] 10/11/2012, 12:18 PM

## 2012-10-11 NOTE — Progress Notes (Signed)
OT Cancellation Note  Patient Details Name: Natalie Hicks MRN: [id number] DOB: 1925-07-30   Cancelled Treatment:    Reason Eval/Treat Not Completed: Pain limiting ability to participate;Other (comment) (moving slowly with PT.  Will check back)  Rosalee Kaufman, OTR/L [phone] 10/11/2012 10/11/2012, 1:33 PM

## 2012-10-12 ENCOUNTER — Inpatient Hospital Stay (HOSPITAL_COMMUNITY): Payer: Medicare Other

## 2012-10-12 LAB — GLUCOSE, CAPILLARY: Glucose-Capillary: 122 mg/dL — ABNORMAL HIGH (ref 70–99)

## 2012-10-12 LAB — URINE CULTURE
Colony Count: NO GROWTH
Culture: NO GROWTH

## 2012-10-12 NOTE — Progress Notes (Signed)
[facility]             Progress Note   Subjective: She is EXTREMELY anxious about MRI that has been ordered for today to evaluate the L-Spine. Still has significant back pain.  Objective: Vital signs in last 24 hours: Temp:  [97.6 F (36.4 C)-98.4 F (36.9 C)] 98.4 F (36.9 C) (10/13 0643) Pulse Rate:  [70-72] 70  (10/13 0643) Resp:  [18-20] 18  (10/13 0643) BP: (121-152)/(47-57) 150/57 mmHg (10/13 0643) SpO2:  [93 %-94 %] 94 % (10/13 0643) Weight change:  Last BM Date: 10/10/12  Intake/Output from previous day: 10/12 0701 - 10/13 0700 In: 1305 [P.O.:480; I.V.:825] Out: 1700 [Urine:1700]     Physical Exam: General: Alert, awake, oriented x3. HEENT: No bruits, no goiter. Heart: Regular rate and rhythm, without murmurs, rubs, gallops. Lungs: Clear to auscultation bilaterally. Abdomen: Soft, nontender, nondistended, positive bowel sounds. Extremities: No clubbing cyanosis or edema with positive pedal pulses. Neuro: Grossly intact, nonfocal.    Lab Results: Basic Metabolic Panel:  Basename 10/11/12 0358 10/10/12 1735  NA 127* 123*  K 3.7 3.9  CL 91* 86*  CO2 28 28  GLUCOSE 71 106*  BUN 20 24*  CREATININE 0.66 0.68  CALCIUM 8.9 9.6  MG -- --  PHOS -- --   Liver Function Tests:  [facility] 10/11/12 0358  AST 17  ALT 7  ALKPHOS 46  BILITOT 0.3  PROT 5.8*  ALBUMIN 2.6*   CBC:  Basename 10/10/12 1735  WBC 9.4  NEUTROABS --  HGB 10.8*  HCT 32.4*  MCV 85.5  PLT 361   CBG:  Basename 10/12/12 0822 10/11/12 2222 10/11/12 1739 10/11/12 1220 10/11/12 0751 10/10/12 2126  GLUCAP 122* 150* 91 132* 70 110*   Hemoglobin A1C:  Basename 10/10/12 1735  HGBA1C 7.2*   Urinalysis:  Basename 10/10/12 1719  COLORURINE YELLOW  LABSPEC 1.015  PHURINE 6.0  GLUCOSEU NEGATIVE  HGBUR NEGATIVE  BILIRUBINUR NEGATIVE  KETONESUR TRACE*  PROTEINUR 100*  UROBILINOGEN 0.2  NITRITE NEGATIVE  LEUKOCYTESUR NEGATIVE    Studies/Results: Dg Foot  Complete Left  10/11/2012  *RADIOLOGY REPORT*  Clinical Data: Bruised and swollen left fourth and fifth toes after a fall.  LEFT FOOT - COMPLETE 3+ VIEW  Comparison: No priors.  Findings: There is a mild irregularity of the trabecular markings in the base of the fifth proximal phalanx, which could suggest the presence of a nondisplaced fracture.  No other definite displaced fracture or dislocation is noted.  There are multifocal degenerative changes of osteoarthritis with joint space narrowing, subchondral sclerosis, subchondral cyst formation and osteophyte formation throughout the DIP, PIP joints and midfoot.  Similar changes are also noted at the second MTP joint.  IMPRESSION: 1.  Possible nondisplaced fracture through the base of the fifth proximal phalanx. 2.  Advanced degenerative changes of osteoarthritis.   Original Report Authenticated By: Florencia Reasons, M.D.     Medications: Scheduled Meds:    . atenolol  50 mg Oral Daily   And  . hydrochlorothiazide  25 mg Oral Daily  . B-complex with vitamin C  1 tablet Oral Daily  . calcium carbonate  1 tablet Oral Q1200  . cholecalciferol  1,000 Units Oral Q1200  . enoxaparin (LOVENOX) injection  40 mg Subcutaneous Q24H  . insulin aspart  0-15 Units Subcutaneous TID WC  . insulin aspart  4 Units Subcutaneous TID WC  . LORazepam  2 mg Intravenous Once  . losartan  100 mg Oral q morning -  10a  .  morphine injection  1 mg Intravenous Once  . multivitamin with minerals  1 tablet Oral Daily  . polyethylene glycol  17 g Oral Daily  . simvastatin  5 mg Oral q1800  . timolol  1 drop Both Eyes QHS   Continuous Infusions:    . sodium chloride 75 mL/hr at 10/12/12 1125  . DISCONTD: sodium chloride 75 mL/hr (10/11/12 0950)   PRN Meds:.acetaminophen, acetaminophen, guaiFENesin, morphine injection, ondansetron (ZOFRAN) IV, ondansetron, senna-docusate, DISCONTD: guaiFENesin  Assessment/Plan:  Principal Problem:  *Lumbar compression  fracture Active Problems:  Back pain  Urinary retention  DM (diabetes mellitus)  Dehydration  CKD (chronic kidney disease)  Hyponatremia   Back Pain -2/2 lumbar compression fractures. -With radicular pain and urinary retention, feel we need to rule out acute cord issues. -MRI L-Spine has been ordered. -Will adjust pain regimen to provide better relief. -Depending on results of MRI may need to consider neurosurgery evaluation vs kyphoplasty for pain relief.  Fall -Suspect related to pain from compression fracture and somnolence from pain meds. -PT is recommending SNF at DC.  Urinary Retention -Has required placement of a foley. -Most likely 2/2 pain meds. -Feel we need to rule out acute cord issues (see above). -UA negative for UTI.  Hyponatremia -Continue IVF. -Recheck BMET in am. -Improved to 127 from 123 on admission. -Suspect related to mild dehydration and decreased PO intake.  DM -CBGs well controlled.  DVT Prophylaxis -Lovenox.   Time spent coordinating care: 25 minutes.   LOS: 2 days   [facility] Pager: [phone] 10/12/2012, 11:30 AM

## 2012-10-12 NOTE — Progress Notes (Signed)
CSW received referral for SNF. CSW attempted to see Pt, however 1st attempt Pt bathing and 2nd Pt was having a procedure done.   Weekday CSW to f/u.   Leron Croak, LCSWA [employer] [phone]

## 2012-10-13 ENCOUNTER — Other Ambulatory Visit (HOSPITAL_COMMUNITY): Payer: Medicare Other

## 2012-10-13 ENCOUNTER — Encounter (HOSPITAL_COMMUNITY): Payer: Self-pay | Admitting: Radiology

## 2012-10-13 LAB — GLUCOSE, CAPILLARY

## 2012-10-13 LAB — BASIC METABOLIC PANEL
Calcium: 8.8 mg/dL (ref 8.4–10.5)
GFR calc Af Amer: >90 mL/min (ref >90)
GFR calc non Af Amer: 79 mL/min — ABNORMAL LOW (ref >90)
Glucose, Bld: 123 mg/dL — ABNORMAL HIGH (ref 70–99)
Potassium: 3.6 mEq/L (ref 3.5–5.1)
Sodium: 134 mEq/L — ABNORMAL LOW (ref 135–145)

## 2012-10-13 MED ORDER — VANCOMYCIN HCL IN DEXTROSE 1-5 GM/200ML-% IV SOLN
1000.0000 mg | INTRAVENOUS | Status: AC
  Administered 2012-10-14: 1000 mg via INTRAVENOUS
  Filled 2012-10-13: qty 200

## 2012-10-13 MED ORDER — GLUCERNA SHAKE PO LIQD
237.0000 mL | Freq: Every day | ORAL | Status: DC
  Administered 2012-10-13: 237 mL via ORAL
  Filled 2012-10-13 (×3): qty 237

## 2012-10-13 NOTE — Progress Notes (Signed)
Nutrition Brief Note  Patient identified for having a low braden score.   Body mass index is 34.79 kg/(m^2). Pt meets criteria for class I obesity based on current BMI.   Current diet order is CHO medium diet, patient is consuming approximately 90-100% of meals at this time. Labs and medications reviewed. Pt reports eating well PTA, 3 meals/day and stable weight. Pt denies any problems chewing or swallowing. Pt interested in getting Glucerna shake as night time snack instead of diabetic snack, will order. No nutrition interventions warranted at this time. If nutrition issues arise, please consult RD.   Levon Hedger MS, RD, LDN [phone] Pager [phone] After Hours Pager

## 2012-10-13 NOTE — Progress Notes (Addendum)
Clinical Social Work Department CLINICAL SOCIAL WORK PLACEMENT NOTE 10/13/2012  Patient:  Natalie Hicks, Natalie Hicks  Account Number:  [id number] Admit date:  10/10/2012  Clinical Social Worker:  Jacelyn Grip  Date/time:  10/13/2012 11:00 AM  Clinical Social Work is seeking post-discharge placement for this patient at the following level of care:   SKILLED NURSING   (*CSW will update this form in Epic as items are completed)   10/13/2012  Patient/family provided with [facility] Department of Clinical Social Work's list of facilities offering this level of care within the geographic area requested by the patient (or if unable, by the patient's family).  10/13/2012  Patient/family informed of their freedom to choose among providers that offer the needed level of care, that participate in Medicare, Medicaid or managed care program needed by the patient, have an available bed and are willing to accept the patient.  10/13/2012  Patient/family informed of [facility]' ownership interest [facility], as well as of the fact that they are under no obligation to receive care at this facility.  PASARR submitted to EDS on 10/13/2012 PASARR number received from [employer] on   FL2 transmitted to all facilities in geographic area requested by pt/family on  10/13/2012 FL2 transmitted to all facilities within larger geographic area on 10/15/2012  Patient informed that his/her managed care company has contracts with or will negotiate with  certain facilities, including the following:     Patient/family informed of bed offers received: 10/14/2012  Patient chooses bed at [facility] Physician recommends and patient chooses bed at    Patient to be transferred to  on  [facility] on 10/16/2012 Patient to be transferred to facility by ambulance Sharin Mons)  The following physician request were entered in Epic:   Additional Comments:   Jacklynn Lewis, MSW, LCSWA    Clinical Social Work [phone]

## 2012-10-13 NOTE — Progress Notes (Addendum)
[facility]             Progress Note   Subjective: Still has back pain.  Objective: Vital signs in last 24 hours: Temp:  [97.7 F (36.5 C)-98.4 F (36.9 C)] 98.4 F (36.9 C) (10/14 0653) Pulse Rate:  [58-77] 68  (10/14 0653) Resp:  [18] 18  (10/14 0653) BP: (122-165)/(43-80) 165/80 mmHg (10/14 0653) SpO2:  [94 %-98 %] 98 % (10/14 0653) Weight change:  Last BM Date: 10/10/12  Intake/Output from previous day: 10/13 0701 - 10/14 0700 In: 2040 [P.O.:240; I.V.:1800] Out: 3350 [Urine:3350] Total I/O In: 360 [P.O.:360] Out: -    Physical Exam: General: Alert, awake, oriented x3. HEENT: No bruits, no goiter. Heart: Regular rate and rhythm, without murmurs, rubs, gallops. Lungs: Clear to auscultation bilaterally. Abdomen: Soft, nontender, nondistended, positive bowel sounds. Extremities: No clubbing cyanosis or edema with positive pedal pulses. Neuro: Grossly intact, nonfocal.    Lab Results: Basic Metabolic Panel:  Basename 10/13/12 0340 10/11/12 0358  NA 134* 127*  K 3.6 3.7  CL 98 91*  CO2 27 28  GLUCOSE 123* 71  BUN 15 20  CREATININE 0.65 0.66  CALCIUM 8.8 8.9  MG -- --  PHOS -- --   Liver Function Tests:  Basename 10/11/12 0358  AST 17  ALT 7  ALKPHOS 46  BILITOT 0.3  PROT 5.8*  ALBUMIN 2.6*   CBC:  Basename 10/10/12 1735  WBC 9.4  NEUTROABS --  HGB 10.8*  HCT 32.4*  MCV 85.5  PLT 361   CBG:  Basename 10/13/12 1152 10/13/12 0714 10/12/12 2136 10/12/12 0822 10/11/12 2222 10/11/12 1739  GLUCAP 179* 120* 93 122* 150* 91   Hemoglobin A1C:  Basename 10/10/12 1735  HGBA1C 7.2*   Urinalysis:  Basename 10/10/12 1719  COLORURINE YELLOW  LABSPEC 1.015  PHURINE 6.0  GLUCOSEU NEGATIVE  HGBUR NEGATIVE  BILIRUBINUR NEGATIVE  KETONESUR TRACE*  PROTEINUR 100*  UROBILINOGEN 0.2  NITRITE NEGATIVE  LEUKOCYTESUR NEGATIVE    Studies/Results: Mr Lumbar Spine Wo Contrast  10/12/2012  *RADIOLOGY REPORT*  Clinical Data: Lumbar  compression fracture.  Urinary retention. Radicular symptoms.  MRI LUMBAR SPINE WITHOUT CONTRAST  Technique:  Multiplanar and multiecho pulse sequences of the lumbar spine were obtained without intravenous contrast.  Comparison: Radiographs 10/06/2012.  Findings: Lumbosacral transitional anatomy is present.  Prominent S1-S2 disc space.  Alignment shows grade 1 anterolisthesis of L4 on L5 and L5 on S1, measuring 3 mm in both levels.  Multilevel degenerative disease is present.  Paraspinal soft tissues demonstrate fluid attenuation in the right costophrenic sulcus suggesting a small pleural effusion.  The spinal cord terminates posterior to the L2 vertebra.  There is a mild levoconvex curvature of the lumbar spine with the apex at L4-L5.  Degenerative endplate changes are present at T11-T12.  Scattered areas of degenerative endplate changes are present. Tiny focus of bone marrow edema is present in the left sacral ala compatible with reactive/degenerative edema associated with the left SI joint.  Acute or subacute L1 and L2 compression fractures are present.  The L1 compression fracture shows slightly greater than 50% loss of anterior vertebral body height.  Minimal retropulsion of L1.  L2 compression fracture shows less than 25% loss of vertebral body height. The loss of height appears less severe than on the standing upright radiographs and there is heterogeneous signal in the superior half of the vertebral body, most compatible with hemorrhage into the fracture cleavage plane.  There is mild concavity of the  T12 superior endplate however based on degenerative changes, this either represents a prominent Schmorl's node or an old compression fracture.  T10-T11:  Disc degeneration and desiccation with minimal bulging. No stenosis.  T11-T12:  Disc degeneration.  Degenerative fluid signal within the disc space.  Infection considered unlikely.  Mild central stenosis associated with broad-based posterior disc bulging,  endplate osteophytes and posterior ligamentum flavum redundancy and facet hypertrophy.  Foramina appear adequately patent.  T12-L1:  Disc desiccation.  Facet hypertrophy.  No stenosis.  L1-L2:  2 mm of retropulsion of the inferior L1 vertebra.  Very mild central stenosis.  No cord deformity.  Foramina appear adequately patent.  L2-L3:  Negative.  L3-L4: Mild disc desiccation.  No stenosis.  L4-L5: Mild to moderate central stenosis associated with anterolisthesis, shallow broad-based disc bulging and facet hypertrophy/ligamentum flavum redundancy.  The anterolisthesis appears degenerative, associated with facet arthrosis.  Crowding of both lateral recesses.  Foramina adequately patent.  L5-S1:  Mild to moderate central stenosis associated anterolisthesis, broad-based posterior disc bulge and posterior ligamentum flavum redundancy.  There is narrowing of both lateral recesses potentially affecting the descending S1 nerves.  Moderate bilateral foraminal stenosis is multifactorial.  IMPRESSION: 1.  Acute on chronic L1 compression fracture with greater 50% loss vertebral body height and minimal retropulsion. 2.  Acute or subacute L2 superior endplate compression fracture with less than 25% loss of vertebral body height on supine positioning.  The changes in height between radiographs and MRI suggest recent fracture and signal in the vertebral body is compatible with hemorrhage into the fracture cleavage plane.  Favor benign osteoporotic compression fracture; underlying osseous lesion considered unlikely.  If vertebral augmentation is elected, consider biopsy. 3.  Lumbar spondylosis most pronounced at L4-L5 and L5-S1 as described above.   Original Report Authenticated By: Andreas Newport, M.D.     Medications: Scheduled Meds:    . atenolol  50 mg Oral Daily   And  . hydrochlorothiazide  25 mg Oral Daily  . B-complex with vitamin C  1 tablet Oral Daily  . calcium carbonate  1 tablet Oral Q1200  . cholecalciferol   1,000 Units Oral Q1200  . feeding supplement  237 mL Oral QHS  . insulin aspart  0-15 Units Subcutaneous TID WC  . insulin aspart  4 Units Subcutaneous TID WC  . losartan  100 mg Oral q morning - 10a  . multivitamin with minerals  1 tablet Oral Daily  . polyethylene glycol  17 g Oral Daily  . simvastatin  5 mg Oral q1800  . timolol  1 drop Both Eyes QHS  . vancomycin  1,000 mg Intravenous On Call  . DISCONTD: enoxaparin (LOVENOX) injection  40 mg Subcutaneous Q24H   Continuous Infusions:    . sodium chloride 75 mL/hr at 10/13/12 0655   PRN Meds:.acetaminophen, acetaminophen, guaiFENesin, morphine injection, ondansetron (ZOFRAN) IV, ondansetron, senna-docusate  Assessment/Plan:  Principal Problem:  *Lumbar compression fracture Active Problems:  Back pain  Urinary retention  DM (diabetes mellitus)  Dehydration  CKD (chronic kidney disease)  Hyponatremia   Back Pain -2/2 lumbar compression fractures. -MRI without acute cord issues. -IR to see for kyphoplasty.  Fall -Suspect related to pain from compression fracture and somnolence from pain meds. -PT is recommending SNF at [place].  Urinary Retention -Has required placement of a foley. -Most likely 2/2 pain meds. -UA negative for UTI. -Will attempt voiding trials after kyphoplasty as pain currently impedes mobilization.  Hyponatremia -Resolved with IVF. -Suspect related to mild dehydration  and decreased PO intake.  DM -CBGs well controlled.  DVT Prophylaxis -Lovenox.  Disposition -IR to see for kyphoplasty. -SW alerted on need for SNF placement.  Time spent coordinating care: 25 minutes.   LOS: 3 days   HERNANDEZ ACOSTA,ESTELA [facility] Pager: [phone] 10/13/2012, 1:35 PM

## 2012-10-13 NOTE — H&P (Signed)
Chief Complaint: Back pain Referring Physician:Hernandez  HPI: Natalie Hicks is an 78 y.o. female who has had back pain since earlier this summer and was diagnosed with an L1 comp Fx. She slowly had improvement in her pain but a few weeks ago developed some recurrent low back pain. She then had a mild fall from the commode at home and her back pain worsened. She presented and is found to have progression of her L1 compression fracture and a new L2 compression fracture. She continues to have significant pain and IR team is requested to eval the pt for possible vertebroplasty. She otherwise feels ok. Concern for UTI due to some retention was ruled out with negative UA and Cx. PMHx and meds reviewed as below.  Past Medical History:  Past Medical History  Diagnosis Date  . Diabetes   . Chronic kidney disease   . Hypertension     Past Surgical History:  Past Surgical History  Procedure Date  . Tonsillectomy   . Hemrhoidectomy     Family History: History reviewed. No pertinent family history.  Social History:  reports that she has never smoked. She does not have any smokeless tobacco history on file. She reports that she does not drink alcohol or use illicit drugs.  Allergies:  Allergies  Allergen Reactions  . Actos (Pioglitazone) Anaphylaxis  . Avandia (Rosiglitazone) Shortness Of Breath  . Aspirin Other (See Comments)    Reaction: cramping and GI upset  . Celecoxib Other (See Comments)    Reaction: GI upset  . Penicillins Hives    Medications: Medications Prior to Admission  Medication Sig Dispense Refill  . atenolol-chlorthalidone (TENORETIC) 50-25 MG per tablet Take 1 tablet by mouth every morning.      . B Complex-C (B-COMPLEX WITH VITAMIN C) tablet Take 1 tablet by mouth daily.      . calcium carbonate (OS-CAL) 600 MG TABS Take 600 mg by mouth daily after lunch.      . cholecalciferol (VITAMIN D) 1000 UNITS tablet Take 1,000 Units by mouth daily after lunch.      .  glyBURIDE-metformin (GLUCOVANCE) 2.5-500 MG per tablet Take 2 tablets by mouth 2 (two) times daily with a meal.      . losartan (COZAAR) 100 MG tablet Take 100 mg by mouth every morning.      . Multiple Vitamin (MULTIVITAMIN WITH MINERALS) TABS Take 1 tablet by mouth daily.      . pravastatin (PRAVACHOL) 20 MG tablet Take 20 mg by mouth at bedtime.      . timolol (TIMOPTIC-XR) 0.5 % ophthalmic gel-forming Place 1 drop into both eyes at bedtime.      . diazepam (VALIUM) 5 MG tablet Take 0.5 tablets (2.5 mg total) by mouth every 8 (eight) hours as needed for anxiety.  15 tablet  0    Please HPI for pertinent positives, otherwise complete 10 system ROS negative.  Physical Exam: Blood pressure 165/80, pulse 68, temperature 98.4 F (36.9 C), temperature source Oral, resp. rate 18, height 5' 1.5" (1.562 m), weight 187 lb 2.7 oz (84.9 kg), SpO2 98.00%. Body mass index is 34.79 kg/(m^2).   General Appearance:  Alert, cooperative. Obese WF  Head:  Normocephalic, without obvious abnormality, atraumatic  ENT: Unremarkable  Neck: Supple, symmetrical, trachea midline, no adenopathy, thyroid: not enlarged, symmetric, no tenderness/mass/nodules  Lungs:   Clear to auscultation bilaterally, no w/r/r, respirations unlabored without use of accessory muscles.  Heart:  Regular rate and rhythm, S1, S2 normal, no  murmur, rub or gallop. Carotids 2+ without bruit.  Back: Tender at upper lumbar region, no redness or gross bony defect  Abdomen:   Soft, non-tender, non distended. Bowel sounds active all four quadrants,  no masses, no organomegaly.  Extremities: Extremities normal, atraumatic, no cyanosis or edema  Neurologic: Normal affect, no gross deficits.   Results for orders placed during the hospital encounter of 10/10/12 (from the past 48 hour(s))  GLUCOSE, CAPILLARY     Status: Abnormal   Collection Time   10/11/12 12:20 PM      Component Value Range Comment   Glucose-Capillary 132 (*) 70 - 99 mg/dL     Comment 1 Documented in Chart      Comment 2 Notify RN     GLUCOSE, CAPILLARY     Status: Normal   Collection Time   10/11/12  5:39 PM      Component Value Range Comment   Glucose-Capillary 91  70 - 99 mg/dL    Comment 1 Documented in Chart      Comment 2 Notify RN     GLUCOSE, CAPILLARY     Status: Abnormal   Collection Time   10/11/12 10:22 PM      Component Value Range Comment   Glucose-Capillary 150 (*) 70 - 99 mg/dL    Comment 1 Documented in Chart      Comment 2 Notify RN     GLUCOSE, CAPILLARY     Status: Abnormal   Collection Time   10/12/12  8:22 AM      Component Value Range Comment   Glucose-Capillary 122 (*) 70 - 99 mg/dL    Comment 1 Documented in Chart      Comment 2 Notify RN     GLUCOSE, CAPILLARY     Status: Normal   Collection Time   10/12/12  9:36 PM      Component Value Range Comment   Glucose-Capillary 93  70 - 99 mg/dL    Comment 1 Notify RN     BASIC METABOLIC PANEL     Status: Abnormal   Collection Time   10/13/12  3:40 AM      Component Value Range Comment   Sodium 134 (*) 135 - 145 mEq/L    Potassium 3.6  3.5 - 5.1 mEq/L    Chloride 98  96 - 112 mEq/L    CO2 27  19 - 32 mEq/L    Glucose, Bld 123 (*) 70 - 99 mg/dL    BUN 15  6 - 23 mg/dL    Creatinine, Ser 1.61  0.50 - 1.10 mg/dL    Calcium 8.8  8.4 - 09.6 mg/dL    GFR calc non Af Amer 79 (*) >90 mL/min    GFR calc Af Amer >90  >90 mL/min   GLUCOSE, CAPILLARY     Status: Abnormal   Collection Time   10/13/12  7:14 AM      Component Value Range Comment   Glucose-Capillary 120 (*) 70 - 99 mg/dL    Comment 1 Notify RN      Mr Lumbar Spine Wo Contrast  10/12/2012  *RADIOLOGY REPORT*  Clinical Data: Lumbar compression fracture.  Urinary retention. Radicular symptoms.  MRI LUMBAR SPINE WITHOUT CONTRAST  Technique:  Multiplanar and multiecho pulse sequences of the lumbar spine were obtained without intravenous contrast.  Comparison: Radiographs 10/06/2012.  Findings: Lumbosacral transitional  anatomy is present.  Prominent S1-S2 disc space.  Alignment shows grade 1 anterolisthesis of L4 on  L5 and L5 on S1, measuring 3 mm in both levels.  Multilevel degenerative disease is present.  Paraspinal soft tissues demonstrate fluid attenuation in the right costophrenic sulcus suggesting a small pleural effusion.  The spinal cord terminates posterior to the L2 vertebra.  There is a mild levoconvex curvature of the lumbar spine with the apex at L4-L5.  Degenerative endplate changes are present at T11-T12.  Scattered areas of degenerative endplate changes are present. Tiny focus of bone marrow edema is present in the left sacral ala compatible with reactive/degenerative edema associated with the left SI joint.  Acute or subacute L1 and L2 compression fractures are present.  The L1 compression fracture shows slightly greater than 50% loss of anterior vertebral body height.  Minimal retropulsion of L1.  L2 compression fracture shows less than 25% loss of vertebral body height. The loss of height appears less severe than on the standing upright radiographs and there is heterogeneous signal in the superior half of the vertebral body, most compatible with hemorrhage into the fracture cleavage plane.  There is mild concavity of the T12 superior endplate however based on degenerative changes, this either represents a prominent Schmorl's node or an old compression fracture.  T10-T11:  Disc degeneration and desiccation with minimal bulging. No stenosis.  T11-T12:  Disc degeneration.  Degenerative fluid signal within the disc space.  Infection considered unlikely.  Mild central stenosis associated with broad-based posterior disc bulging, endplate osteophytes and posterior ligamentum flavum redundancy and facet hypertrophy.  Foramina appear adequately patent.  T12-L1:  Disc desiccation.  Facet hypertrophy.  No stenosis.  L1-L2:  2 mm of retropulsion of the inferior L1 vertebra.  Very mild central stenosis.  No cord deformity.   Foramina appear adequately patent.  L2-L3:  Negative.  L3-L4: Mild disc desiccation.  No stenosis.  L4-L5: Mild to moderate central stenosis associated with anterolisthesis, shallow broad-based disc bulging and facet hypertrophy/ligamentum flavum redundancy.  The anterolisthesis appears degenerative, associated with facet arthrosis.  Crowding of both lateral recesses.  Foramina adequately patent.  L5-S1:  Mild to moderate central stenosis associated anterolisthesis, broad-based posterior disc bulge and posterior ligamentum flavum redundancy.  There is narrowing of both lateral recesses potentially affecting the descending S1 nerves.  Moderate bilateral foraminal stenosis is multifactorial.  IMPRESSION: 1.  Acute on chronic L1 compression fracture with greater 50% loss vertebral body height and minimal retropulsion. 2.  Acute or subacute L2 superior endplate compression fracture with less than 25% loss of vertebral body height on supine positioning.  The changes in height between radiographs and MRI suggest recent fracture and signal in the vertebral body is compatible with hemorrhage into the fracture cleavage plane.  Favor benign osteoporotic compression fracture; underlying osseous lesion considered unlikely.  If vertebral augmentation is elected, consider biopsy. 3.  Lumbar spondylosis most pronounced at L4-L5 and L5-S1 as described above.   Original Report Authenticated By: Andreas Newport, M.D.     Assessment/Plan BAck pain secondary to L1 and L2 compression fractures. Check coags MD has reviewed pt for VP/KP Plan for intervention tomorrow. NPO p MN Explained procedure to pt and daughter, including risks and complications. Consent signed in chart  Brayton El PA-C 10/13/2012, 11:54 AM

## 2012-10-13 NOTE — Progress Notes (Deleted)
Discharge instructions given. Heart failure packet given. No questions asked, verbalized understanding. Left via wheelchair with son. Siddiqi, Daron Breeding Jean  

## 2012-10-13 NOTE — Clinical Social Work Psychosocial (Signed)
     Clinical Social Work Department BRIEF PSYCHOSOCIAL ASSESSMENT 10/13/2012  Patient:  Natalie Hicks, Natalie Hicks     Account Number:  [id number]     Admit date:  10/10/2012  Clinical Social Worker:  Jacelyn Grip  Date/Time:  10/13/2012 10:45 AM  Referred by:  Physician  Date Referred:  10/13/2012 Referred for  SNF Placement   Other Referral:   Interview type:  Patient Other interview type:   and patient daughter at bedside    PSYCHOSOCIAL DATA Living Status:  ALONE Admitted from facility:   Level of care:   Primary support name:  Rosanne Ashing Vandenbrink/son/[phone] Primary support relationship to patient:  CHILD, ADULT Degree of support available:   adequate    CURRENT CONCERNS Current Concerns  Post-Acute Placement   Other Concerns:    SOCIAL WORK ASSESSMENT / PLAN CSW received notification from MD that pt needing ST SNF at discharge. Per MD, pt planned to have kyphoplasty procedure and note yet medically stable for discharge. CSW met with pt and pt daughter from [place] at bedside. CSW discussed recommendation for SNF at discharge. CSW clarified pt questions and concerns. Pt agreeable to SNF search in [facility]. Pt stated that she would want assistance from pt son in regard to decision for SNF and pt son is currently out of town, but can be reached by phone. Pt agreeable to CSW contacting pt son once bed offers received. Pt inquired about inpatient rehab at [facility] and CSW reviewed chart and noted that PT recommended an inpatient rehab screen. CSW discussed this with pt and pt daughter and discussed that both discharge options would be explored. Pt and pt daughter agreeable to this. CSW to follow up with pt in regard to bed offers. CSW to continue to follow and facilitate pt discharge needs when pt medically stable for discharge.   Assessment/plan status:  Psychosocial Support/Ongoing Assessment of Needs Other assessment/ plan:   discharge planning    Information/referral to community resources:   [facility] list    PATIENTS/FAMILYS RESPONSE TO PLAN OF CARE: Pt alert and oriented and pleasant. Pt daughter appears supportive and actively involved in pt care, but pt reports that pt son lives local and would be assisting pt in decision about SNF.

## 2012-10-13 NOTE — Progress Notes (Signed)
OT Cancellation Note  Patient Details Name: Natalie Hicks MRN: [id number] DOB: 02-Apr-1925   Cancelled Treatment:    Reason Eval/Treat Not Completed: Pain limiting ability to participate. Per PT note, pt unable to mobilize at all. Also awaiting decision regarding kyphoplasty. Will continue to check back.  Kriya Westra A OTR/L [phone] 10/13/2012, 10:33 AM

## 2012-10-13 NOTE — Progress Notes (Signed)
Physical Therapy Treatment Patient Details Name: Natalie Hicks MRN: [id number] DOB: [birth year] Today's Date: 10/13/2012 Time: 4098-1191 PT Time Calculation (min): 11 min  PT Assessment / Plan / Recommendation Comments on Treatment Session  pt limited by pain today, but was able to perform isometric LE exercise and partial arm lifts as well as deep breathing.  Awaiting decision about kyphoplasty before more mobility progression    Follow Up Recommendations  Post acute inpatient     Does the patient have the potential to tolerate intense rehabilitation  Yes, Recommend IP Rehab Screening  Barriers to Discharge        Equipment Recommendations  Rolling walker with 5" wheels;3 in 1 bedside comode    Recommendations for Other Services    Frequency Min 3X/week   Plan Discharge plan remains appropriate    Precautions / Restrictions     Pertinent Vitals/Pain Pt with pain in back and left side today.  Mobility limited by pain    Mobility  Bed Mobility Details for Bed Mobility Assistance: Not tested due to pain and patient reluctance to move at all. Awaiting MD to come in Transfers Transfers: Not assessed Ambulation/Gait Ambulation/Gait Assistance: Not tested (comment)    Exercises General Exercises - Lower Extremity Ankle Circles/Pumps: AROM;Both;10 reps;Supine Quad Sets: AROM;Both;10 reps;Supine Gluteal Sets: AROM;Both;10 reps;Supine Other Exercises Other Exercises: abd sets Other Exercises: raise each arm one at a time with concentration on keeping core still   PT Diagnosis:    PT Problem List:   PT Treatment Interventions:     PT Goals Acute Rehab PT Goals Pt will Perform Home Exercise Program: with supervision, verbal cues required/provided PT Goal: Perform Home Exercise Program - Progress: Goal set today  Visit Information  Last PT Received On: 10/13/12 Assistance Needed: +1    Subjective Data  Subjective: "Now let me see if I go this right.."  pt able to  repeat demonstrate LE gentle isometric bed exercise Patient Stated Goal: to have pain controlled   Cognition  Overall Cognitive Status: Appears within functional limits for tasks assessed/performed Arousal/Alertness: Awake/alert Orientation Level: Appears intact for tasks assessed Behavior During Session: [facility] for tasks performed    Balance  Balance Balance Assessed: No  End of Session PT - End of Session Activity Tolerance: Patient limited by pain Patient left: in bed   GP     Rosey Bath K. Brown,PT [phone] 10/13/2012, 9:57 AM

## 2012-10-14 ENCOUNTER — Ambulatory Visit (HOSPITAL_COMMUNITY): Payer: Medicare Other

## 2012-10-14 LAB — GLUCOSE, CAPILLARY: Glucose-Capillary: 202 mg/dL — ABNORMAL HIGH (ref 70–99)

## 2012-10-14 LAB — PROTIME-INR
INR: 1.1 (ref 0.00–1.49)
Prothrombin Time: 14.1 seconds (ref 11.6–15.2)

## 2012-10-14 LAB — APTT: aPTT: 34 seconds (ref 24–37)

## 2012-10-14 MED ORDER — LIDOCAINE HCL 1 % IJ SOLN
INTRAMUSCULAR | Status: AC
  Filled 2012-10-14: qty 20

## 2012-10-14 MED ORDER — NALOXONE HCL 0.4 MG/ML IJ SOLN
INTRAMUSCULAR | Status: AC
  Filled 2012-10-14: qty 1

## 2012-10-14 MED ORDER — NALOXONE HCL 0.4 MG/ML IJ SOLN
0.4000 mg | INTRAMUSCULAR | Status: DC | PRN
  Administered 2012-10-14: 0.4 mg via INTRAVENOUS

## 2012-10-14 MED ORDER — ONDANSETRON HCL 4 MG/2ML IJ SOLN
INTRAMUSCULAR | Status: AC | PRN
  Administered 2012-10-14: 4 mg via INTRAVENOUS

## 2012-10-14 MED ORDER — ONDANSETRON HCL 4 MG/2ML IJ SOLN
INTRAMUSCULAR | Status: AC
  Filled 2012-10-14: qty 2

## 2012-10-14 MED ORDER — MIDAZOLAM HCL 2 MG/2ML IJ SOLN
INTRAMUSCULAR | Status: AC | PRN
  Administered 2012-10-14 (×3): 2 mg via INTRAVENOUS

## 2012-10-14 MED ORDER — VITAMINS A & D EX OINT
TOPICAL_OINTMENT | CUTANEOUS | Status: AC
  Administered 2012-10-14: 5
  Filled 2012-10-14: qty 5

## 2012-10-14 MED ORDER — FENTANYL CITRATE 0.05 MG/ML IJ SOLN
INTRAMUSCULAR | Status: AC | PRN
  Administered 2012-10-14: 50 ug via INTRAVENOUS
  Administered 2012-10-14 (×2): 100 ug via INTRAVENOUS
  Administered 2012-10-14: 50 ug via INTRAVENOUS

## 2012-10-14 NOTE — Plan of Care (Signed)
Problem: Phase I Progression Outcomes Goal: Voiding-avoid urinary catheter unless indicated Outcome: Progressing Foley

## 2012-10-14 NOTE — Progress Notes (Signed)
Clinical Social Worker attempted to meet with pt to provide bed offers. Pt currently not in pt room as pt is receiving kyphoplasty today. Clinical Social Worker to follow up with pt in regard to bed offers and to clarify pt social security number as Psychologist, forensic system is stating that social security number listed does not match pt name and clarification is needed in order to obtain pasarr for SNF placement. Clinical Social Worker to continue to follow.  Jacklynn Lewis, MSW, LCSWA  Clinical Social Work [phone]

## 2012-10-14 NOTE — Progress Notes (Signed)
[facility]             Progress Note   Subjective: Still has back pain. Seen earlier today prior to kyphoplasty.  Objective: Vital signs in last 24 hours: Temp:  [97.6 F (36.4 C)-98.5 F (36.9 C)] 97.6 F (36.4 C) (10/15 1524) Pulse Rate:  [57-84] 75  (10/15 1524) Resp:  [5-24] 14  (10/15 1524) BP: (114-192)/(47-103) 154/79 mmHg (10/15 1524) SpO2:  [92 %-100 %] 95 % (10/15 1524) Weight change:  Last BM Date: 10/13/12 (small BM per patient)  Intake/Output from previous day: 10/14 0701 - 10/15 0700 In: 1620 [P.O.:720; I.V.:900] Out: 2450 [Urine:2450] Total I/O In: -  Out: 1050 [Urine:1050]   Physical Exam: General: Alert, awake, oriented x3. HEENT: No bruits, no goiter. Heart: Regular rate and rhythm, without murmurs, rubs, gallops. Lungs: Clear to auscultation bilaterally. Abdomen: Soft, nontender, nondistended, positive bowel sounds. Extremities: No clubbing cyanosis or edema with positive pedal pulses. Neuro: Grossly intact, nonfocal.    Lab Results: Basic Metabolic Panel:  Basename 10/13/12 0340  NA 134*  K 3.6  CL 98  CO2 27  GLUCOSE 123*  BUN 15  CREATININE 0.65  CALCIUM 8.8  MG --  PHOS --   CBG:  Basename 10/14/12 0733 10/13/12 2110 10/13/12 1650 10/13/12 1152 10/13/12 0714 10/12/12 2136  GLUCAP 202* 143* 270* 179* 120* 93    Medications: Scheduled Meds:    . atenolol  50 mg Oral Daily   And  . hydrochlorothiazide  25 mg Oral Daily  . B-complex with vitamin C  1 tablet Oral Daily  . calcium carbonate  1 tablet Oral Q1200  . cholecalciferol  1,000 Units Oral Q1200  . feeding supplement  237 mL Oral QHS  . insulin aspart  0-15 Units Subcutaneous TID WC  . insulin aspart  4 Units Subcutaneous TID WC  . lidocaine      . losartan  100 mg Oral q morning - 10a  . multivitamin with minerals  1 tablet Oral Daily  . naloxone      . ondansetron      . polyethylene glycol  17 g Oral Daily  . simvastatin  5 mg Oral q1800  .  timolol  1 drop Both Eyes QHS  . vancomycin  1,000 mg Intravenous On Call  . vitamin A & D       Continuous Infusions:    . sodium chloride 1,000 mL (10/14/12 0903)   PRN Meds:.acetaminophen, acetaminophen, fentaNYL, guaiFENesin, midazolam, morphine injection, naloxone (NARCAN) injection, ondansetron (ZOFRAN) IV, ondansetron, ondansetron, senna-docusate  Assessment/Plan:  Principal Problem:  *Lumbar compression fracture Active Problems:  Back pain  Urinary retention  DM (diabetes mellitus)  Dehydration  CKD (chronic kidney disease)  Hyponatremia   Back Pain -2/2 lumbar compression fractures. -MRI without acute cord issues. -Kyphoplasty planned by IR today.  Fall -Suspect related to pain from compression fracture and somnolence from pain meds. -PT is recommending SNF at DC.  Urinary Retention -Has required placement of a foley. -Most likely 2/2 pain meds. -UA negative for UTI. -Will attempt voiding trials after kyphoplasty as pain currently impedes mobilization.  Hyponatremia -Resolved with IVF. -Suspect related to mild dehydration and decreased PO intake.  DM -CBGs with fair control.  DVT Prophylaxis -Lovenox.  Disposition -Kyphoplasty today. -Plan for SNF. -Would DC foley and start voiding trail in am (if SNF is ready for her tomorrow this can certainly be accomplished at SNF).  Time spent coordinating care: 25 minutes.   LOS:  4 days   Chaya Jan [facility] Pager: [phone] 10/14/2012, 3:42 PM

## 2012-10-14 NOTE — Procedures (Signed)
L1 and L2 KP No comp Bipedicle approach

## 2012-10-14 NOTE — Progress Notes (Signed)
OT Cancellation Note  Patient Details Name: Natalie Hicks MRN: [id number] DOB: August 20, 1925   Cancelled Treatment:    Reason Eval/Treat Not Completed: Other (comment) (plan for kyphoplasty today) Will try back tomorrow.  [name] [phone] 10/14/2012, 11:03 AM

## 2012-10-14 NOTE — Progress Notes (Addendum)
Clinical Social Worker followed up with pt and pt daughter following kyphoplasty procedure in regard to SNF bed offers. Clinical Social Worker provided bed offers at bedside and confirmed pt SS# and obtained copy of pt Medicare card and submitted to pasarr in order to obtain pasarr number. Pt continues to express interest in [facility] as an option for discharge plan. Clinical Social Worker contacted inpatient rehab RN, Roderic Palau who reviewed chart and noted that PT recommended inpatient rehab screen and inpatient rehab RN, Roderic Palau recommended Inpatient Rehab Consult to be ordered. MD notified. Clinical Child psychotherapist discussed with pt and encouraged pt to review bed offers with pt daughter and Clinical Social Worker will follow up tomorrow in regard to inpatient rehab recommendations and decision for SNF if pt is not appropriate for inpatient rehab. Clinical Social Worker to continue to follow and assist with pt discharge planning needs.   Jacklynn Lewis, MSW, LCSWA  Clinical Social Work [phone]

## 2012-10-14 NOTE — ED Notes (Signed)
Resp. Depression. Assistance needed. Narcan given. Pt. A+O x2. Will continue to monitor in recovery room 7.

## 2012-10-14 NOTE — Progress Notes (Signed)
Pt. Returned from IR  After having a Kyphoplasty done, she is to remain flat x3 hours. After 3 hours pt's back was cleaned and 2 bandaids were applied to the 4 puncture wounds.Red blood noted on sheets. Ice was applied for 20 minutes and removed for 30 minutes. Pt rolling from side to side well. Repositioned off back after 3 hours. She was incontinent of Moderate amount of brown soft stool. Pt ate supper-full liquids at 2000, tolerated well. CBG will be checked at HS. Dry scabs noted on Lt shin. Daughter stayed with pt all day.

## 2012-10-14 NOTE — Plan of Care (Signed)
Problem: Phase I Progression Outcomes Goal: OOB as tolerated unless otherwise ordered Outcome: Progressing Kyphoplasty done today

## 2012-10-15 DIAGNOSIS — M8448XA Pathological fracture, other site, initial encounter for fracture: Secondary | ICD-10-CM

## 2012-10-15 DIAGNOSIS — E1142 Type 2 diabetes mellitus with diabetic polyneuropathy: Secondary | ICD-10-CM

## 2012-10-15 DIAGNOSIS — R269 Unspecified abnormalities of gait and mobility: Secondary | ICD-10-CM

## 2012-10-15 LAB — GLUCOSE, CAPILLARY: Glucose-Capillary: 170 mg/dL — ABNORMAL HIGH (ref 70–99)

## 2012-10-15 MED ORDER — HYDROCODONE-ACETAMINOPHEN 5-325 MG PO TABS
0.5000 | ORAL_TABLET | Freq: Four times a day (QID) | ORAL | Status: DC | PRN
  Administered 2012-10-15 – 2012-10-16 (×2): 1 via ORAL
  Filled 2012-10-15 (×2): qty 1

## 2012-10-15 MED ORDER — TAMSULOSIN HCL 0.4 MG PO CAPS
0.4000 mg | ORAL_CAPSULE | Freq: Every day | ORAL | Status: DC
  Administered 2012-10-15 – 2012-10-16 (×2): 0.4 mg via ORAL
  Filled 2012-10-15 (×2): qty 1

## 2012-10-15 NOTE — Progress Notes (Signed)
Subjective: Patient's back pain is improved however not complaining of right hip pain.  No specific complaints.  Objective: Vital signs in last 24 hours: Filed Vitals:   10/14/12 1749 10/14/12 2156 10/14/12 2202 10/15/12 0539  BP: 139/59 170/57 146/75 151/53  Pulse: 74 76  74  Temp: 97.4 F (36.3 C) 98.2 F (36.8 C)  97.9 F (36.6 C)  TempSrc:  Oral  Oral  Resp: 20 20  16   Height:      Weight:      SpO2: 99% 94%  97%   Weight change:   Intake/Output Summary (Last 24 hours) at 10/15/12 1320 Last data filed at 10/15/12 0600  Gross per 24 hour  Intake   2126 ml  Output    900 ml  Net   1226 ml    Physical Exam: General: Awake, Oriented, No acute distress. HEENT: EOMI. Neck: Supple CV: S1 and S2 Lungs: Clear to ascultation bilaterally Abdomen: Soft, Nontender, Nondistended, +bowel sounds. Ext: Good pulses. Trace edema.  Lab Results: Basic Metabolic Panel:  Lab 10/13/12 1610 10/11/12 0358 10/10/12 1735  NA 134* 127* 123*  K 3.6 3.7 3.9  CL 98 91* 86*  CO2 27 28 28   GLUCOSE 123* 71 106*  BUN 15 20 24*  CREATININE 0.65 0.66 0.68  CALCIUM 8.8 8.9 9.6  MG -- -- --  PHOS -- -- --   Liver Function Tests:  Lab 10/11/12 0358  AST 17  ALT 7  ALKPHOS 46  BILITOT 0.3  PROT 5.8*  ALBUMIN 2.6*   No results found for this basename: LIPASE:5,AMYLASE:5 in the last 168 hours No results found for this basename: AMMONIA:5 in the last 168 hours CBC:  Lab 10/10/12 1735  WBC 9.4  NEUTROABS --  HGB 10.8*  HCT 32.4*  MCV 85.5  PLT 361   Cardiac Enzymes: No results found for this basename: CKTOTAL:5,CKMB:5,CKMBINDEX:5,TROPONINI:5 in the last 168 hours BNP (last 3 results) No results found for this basename: PROBNP:3 in the last 8760 hours CBG:  Lab 10/15/12 1158 10/15/12 0735 10/14/12 2209 10/14/12 1649 10/14/12 0733  GLUCAP 146* 170* 232* 138* 202*   No results found for this basename: HGBA1C:5 in the last 72 hours Other Labs: No components found with this  basename: POCBNP:3 No results found for this basename: DDIMER:2 in the last 168 hours No results found for this basename: CHOL:2,HDL:2,LDLCALC:2,TRIG:2,CHOLHDL:2,LDLDIRECT:2 in the last 168 hours No results found for this basename: TSH,T4TOTAL,FREET3,T3FREE,FREET4,THYROIDAB in the last 168 hours No results found for this basename: VITAMINB12:2,FOLATE:2,FERRITIN:2,TIBC:2,IRON:2,RETICCTPCT:2 in the last 168 hours  Micro Results: Recent Results (from the past 240 hour(s))  URINE CULTURE     Status: Normal   Collection Time   10/10/12  5:19 PM      Component Value Range Status Comment   Specimen Description URINE, CATHETERIZED   Final    Special Requests NONE   Final    Culture  Setup Time 10/11/2012 01:42   Final    Colony Count NO GROWTH   Final    Culture NO GROWTH   Final    Report Status 10/12/2012 FINAL   Final     Studies/Results: Ir Kyphoplasty Or Sacroplasty  10/14/2012  *RADIOLOGY REPORT*  Clinical Data/Indication: L1 AND L2 COMPRESSION FRACTURES.  LUMBAR KYPHOPLASTY  Sedation: Versed six mg, Fentanyl 300 mg.  Total Moderate Sedation Time: 60 minutes.  Vancomycin was given within two hours of incision.  Vancomycin was given due to an antibiotic allergy.  Fluoroscopy Time: 22.2 minutes.  Procedure:  The procedure, risks, benefits, and alternatives were explained to the patient. Questions regarding the procedure were encouraged and answered. The patient understands and consents to the procedure.  The back was prepped with betadine in a sterile fashion, and a sterile drape was applied covering the operative field. A sterile gown and sterile gloves were used for the procedure.  1% lidocaine was utilized for local infiltration.  Under fluoroscopic guidance, a 822 gauge spinal needle was advanced to the left L1 pedicle.  The periosteum was anesthetized with 1% lidocaine.  The 10 gauge express kyphoplasty needle was then advanced into the vertebral body via the left pedicle.  As it traversed the  posterior wall, a drill was utilized to create a tract to the anterior third of the vertebral bodies.  The identical procedure was performed in the right pedicle.  The kyphoplasty balloons were then inserted and insufflated to 200 pounds per square inch.  The identical procedure was then performed at the L2 vertebral body.  The kyphoplasty balloons and L1 were removed and inserted into the L2 vertebral body.  They were insufflated.  Cement was injected into the L1 vertebral body through the trocars which were inserted through the 10 gauge needles.  Both tracts were filled.  The balloon is in the L2 vertebral body were removed and a the trocar was utilized to inject the L2 vertebral body with cement. The trocar was removed.  The 10 gauge guide needles were removed. Final imaging was performed.  Findings: Imaging demonstrates needle placement into the L1 and L2 vertebral body via by pedicle approach.  Subsequent imaging demonstrates tract creation with the drill and subsequent balloon kyphoplasty bilaterally at L1 and L2.  Final image at L1 demonstrates contrast filling the cement extending from the inferior to superior end plate and crossing the midline.  Within L2, there is dense filling of contrast within the upper half of the vertebral body crossing the midline.  The fracture and L2 only involve the upper half of the vertebral body.  Complications: None.  IMPRESSION: Successful L1 and L2 kyphoplasty.   Original Report Authenticated By: Donavan Burnet, M.D.     Medications: I have reviewed the patient's current medications. Scheduled Meds:   . atenolol  50 mg Oral Daily   And  . hydrochlorothiazide  25 mg Oral Daily  . B-complex with vitamin C  1 tablet Oral Daily  . calcium carbonate  1 tablet Oral Q1200  . cholecalciferol  1,000 Units Oral Q1200  . feeding supplement  237 mL Oral QHS  . insulin aspart  0-15 Units Subcutaneous TID WC  . insulin aspart  4 Units Subcutaneous TID WC  . lidocaine        . losartan  100 mg Oral q morning - 10a  . multivitamin with minerals  1 tablet Oral Daily  . naloxone      . ondansetron      . polyethylene glycol  17 g Oral Daily  . simvastatin  5 mg Oral q1800  . timolol  1 drop Both Eyes QHS   Continuous Infusions:   . sodium chloride 75 mL/hr at 10/15/12 0154   PRN Meds:.acetaminophen, acetaminophen, fentaNYL, guaiFENesin, midazolam, morphine injection, naloxone (NARCAN) injection, ondansetron (ZOFRAN) IV, ondansetron, ondansetron, senna-docusate  Assessment/Plan: Back Pain/acute on chronic L1 Compression fracture with acute or subacute L2 superior endplate compression fracture on MRI of L-spine on 10/12/2012  Patient is status post kyphoplasty on 10/14/2012, pain improved but still having significant pain.  Continue to work with physical therapy.  Transition IV morphine to oral hydrocodone.  Urinary Retention  Likely due to pain medications.  Discontinue Foley catheter today, start Flomax.  Start voiding trials today.  If patient has urinary retention likely will need placement of Foley catheter with outpatient voiding trails in about 3-4 days.  Fall  Suspect related to pain from compression fracture and somnolence from pain meds. PT is recommending SNF at DC.   Hyponatremia  Resolved with IVF. Suspect related to mild dehydration and decreased PO intake.  Discontinue hydrochlorothiazide, as hydrochlorothiazide in the elderly could be contributing to hyponatremia.  Saline lock fluids reassess sodium tomorrow.  DM  CBGs with fair control.  Continue NovoLog 3 times a day with meals.  Sliding scale insulin.  DVT Prophylaxis  Lovenox.   Disposition  Plan for SNF, inpatient rehabilitation, Dr. Callie Fielding, evaluated the patient and did not think that she was an appropriate candidate.  If her pain is better controlled and based on voiding trails plan on discharging the patient to SNF tomorrow.   LOS: 5 days  Florentina Marquart A, MD 10/15/2012, 1:20  PM

## 2012-10-15 NOTE — Progress Notes (Signed)
Subjective: Pt with decreased low back pain; still has rt hip pain  Objective: Vital signs in last 24 hours: Temp:  [97.4 F (36.3 C)-98.2 F (36.8 C)] 97.9 F (36.6 C) (10/16 0539) Pulse Rate:  [65-80] 74  (10/16 0539) Resp:  [7-24] 16  (10/16 0539) BP: (137-186)/(53-81) 151/53 mmHg (10/16 0539) SpO2:  [94 %-100 %] 97 % (10/16 0539) Last BM Date: 10/14/12  Intake/Output from previous day: 10/15 0701 - 10/16 0700 In: 2126 [P.O.:400; I.V.:1725] Out: 1950 [Urine:1950] Intake/Output this shift:    Pt moving all fours; no new neurological changes; puncture site mid to low back clean and dry  Lab Results:  No results found for this basename: WBC:2,HGB:2,HCT:2,PLT:2 in the last 72 hours BMET  [facility] 10/13/12 0340  NA 134*  K 3.6  CL 98  CO2 27  GLUCOSE 123*  BUN 15  CREATININE 0.65  CALCIUM 8.8   PT/INR  Basename 10/14/12 0343  LABPROT 14.1  INR 1.10   ABG No results found for this basename: PHART:2,PCO2:2,PO2:2,HCO3:2 in the last 72 hours  Studies/Results: Ir Kyphoplasty Or Sacroplasty  10/14/2012  *RADIOLOGY REPORT*  Clinical Data/Indication: L1 AND L2 COMPRESSION FRACTURES.  LUMBAR KYPHOPLASTY  Sedation: Versed six mg, Fentanyl 300 mg.  Total Moderate Sedation Time: 60 minutes.  Vancomycin was given within two hours of incision.  Vancomycin was given due to an antibiotic allergy.  Fluoroscopy Time: 22.2 minutes.  Procedure: The procedure, risks, benefits, and alternatives were explained to the patient. Questions regarding the procedure were encouraged and answered. The patient understands and consents to the procedure.  The back was prepped with betadine in a sterile fashion, and a sterile drape was applied covering the operative field. A sterile gown and sterile gloves were used for the procedure.  1% lidocaine was utilized for local infiltration.  Under fluoroscopic guidance, a 822 gauge spinal needle was advanced to the left L1 pedicle.  The periosteum was  anesthetized with 1% lidocaine.  The 10 gauge express kyphoplasty needle was then advanced into the vertebral body via the left pedicle.  As it traversed the posterior wall, a drill was utilized to create a tract to the anterior third of the vertebral bodies.  The identical procedure was performed in the right pedicle.  The kyphoplasty balloons were then inserted and insufflated to 200 pounds per square inch.  The identical procedure was then performed at the L2 vertebral body.  The kyphoplasty balloons and L1 were removed and inserted into the L2 vertebral body.  They were insufflated.  Cement was injected into the L1 vertebral body through the trocars which were inserted through the 10 gauge needles.  Both tracts were filled.  The balloon is in the L2 vertebral body were removed and a the trocar was utilized to inject the L2 vertebral body with cement. The trocar was removed.  The 10 gauge guide needles were removed. Final imaging was performed.  Findings: Imaging demonstrates needle placement into the L1 and L2 vertebral body via by pedicle approach.  Subsequent imaging demonstrates tract creation with the drill and subsequent balloon kyphoplasty bilaterally at L1 and L2.  Final image at L1 demonstrates contrast filling the cement extending from the inferior to superior end plate and crossing the midline.  Within L2, there is dense filling of contrast within the upper half of the vertebral body crossing the midline.  The fracture and L2 only involve the upper half of the vertebral body.  Complications: None.  IMPRESSION: Successful L1 and L2 kyphoplasty.  Original Report Authenticated By: Donavan Burnet, M.D.     Anti-infectives: Anti-infectives     Start     Dose/Rate Route Frequency Ordered Stop   10/14/12 0600   vancomycin (VANCOCIN) IVPB 1000 mg/200 mL premix        1,000 mg 200 mL/hr over 60 Minutes Intravenous On call 10/13/12 1202 10/14/12 1021          Assessment/Plan: s/p L1/2 KP 10/15;  PT/OOB; cool compress to low back prn; if hip pain cont check plain film  LOS: 5 days    ALLRED,D [facility] 10/15/2012

## 2012-10-15 NOTE — Progress Notes (Signed)
Rehab admissions - Evaluated for possible admission.  Please see rehab consult done by Dr. Riley Kill recommending SNF.  Recommend proceeding with SNF placement for continued therapies.  Call me for questions.  #[phone]

## 2012-10-15 NOTE — Progress Notes (Signed)
Physical Therapy Treatment Patient Details Name: Natalie Hicks MRN: [id number] DOB: Mar 22, 1925 Today's Date: 10/15/2012 Time: 1324-4010 PT Time Calculation (min): 30 min  PT Assessment / Plan / Recommendation Comments on Treatment Session  Activity tolerance limited by pain. +2 total assist for rolling and sidelying to sit. Pt stood with RW for 1 minute, tolerance limited by pain. Pt very pleasant and puts forth good effort.  SNF recommended. Noted pt had kyphoplasty yesterday.    Follow Up Recommendations  Post acute inpatient;Supervision/Assistance - 24 hour     Does the patient have the potential to tolerate intense rehabilitation  No, Recommend SNF  Barriers to Discharge        Equipment Recommendations  Rolling walker with 5" wheels;3 in 1 bedside comode    Recommendations for Other Services    Frequency Min 3X/week   Plan Discharge plan remains appropriate    Precautions / Restrictions Restrictions Weight Bearing Restrictions: No   Pertinent Vitals/Pain **10/10 low back RN notified, morphine administered*    Mobility  Bed Mobility Bed Mobility: Rolling Left;Left Sidelying to Sit;Scooting to [facility];Sit to Sidelying Left Rolling Left: 1: +2 Total assist Rolling Left: Patient Percentage: 10% Left Sidelying to Sit: 1: +2 Total assist Left Sidelying to Sit: Patient Percentage: 10% Sit to Sidelying Left: 1: +2 Total assist Sit to Sidelying Left: Patient Percentage: 10% Scooting to HOB: 1: +2 Total assist Scooting to [facility]: Patient Percentage: 0% Details for Bed Mobility Assistance: pt educated on log roll technique to decrease pain with transfers, increased assist due to pain, increased time to perform Transfers Transfers: Sit to Stand;Stand to Sit Sit to Stand: 4: Min assist;With upper extremity assist Stand to Sit: 4: Min assist;With upper extremity assist Details for Transfer Assistance: pt 75%, VCs hand placement; pt stood with RW for 1 minute with heavy use of  BUEs, tolerance limited by pain, pt unable to weight shift to attempt taking a step Ambulation/Gait Ambulation/Gait Assistance: Not tested (comment) (unable due to pain)    Exercises     PT Diagnosis:    PT Problem List:   PT Treatment Interventions:     PT Goals Acute Rehab PT Goals PT Goal Formulation: With patient Time For Goal Achievement: 10/25/12 Potential to Achieve Goals: Fair Pt will Roll Supine to Left Side: with min assist PT Goal: Rolling Supine to Left Side - Progress: Progressing toward goal Pt will go Supine/Side to Sit: with min assist PT Goal: Supine/Side to Sit - Progress: Progressing toward goal Pt will go Sit to Supine/Side: with min assist PT Goal: Sit to Supine/Side - Progress: Progressing toward goal Pt will go Sit to Stand: with min assist PT Goal: Sit to Stand - Progress: Met Pt will go Stand to Sit: with min assist PT Goal: Stand to Sit - Progress: Met Pt will Ambulate: 16 - 50 feet;with mod assist;with least restrictive assistive device PT Goal: Ambulate - Progress: Not progressing  Visit Information  Last PT Received On: 10/15/12 Assistance Needed: +2 PT/OT Co-Evaluation/Treatment: Yes    Subjective Data  Subjective: Don't let me fall, it's awful to fall.  Patient Stated Goal: be able to walk   Cognition  Overall Cognitive Status: Appears within functional limits for tasks assessed/performed Arousal/Alertness: Awake/alert Orientation Level: Appears intact for tasks assessed Behavior During Session: [facility] for tasks performed    Balance  Balance Balance Assessed: Yes Static Sitting Balance Static Sitting - Balance Support: Bilateral upper extremity supported;Feet unsupported Static Sitting - Level of Assistance: 5: Stand  by assistance;4: Min assist Static Sitting - Comment/# of Minutes: 5 minutes at EOB, initially required min A for balance, then SBA  End of Session PT - End of Session Activity Tolerance: Patient limited by pain Patient  left: in bed   GP     Tamala Ser 10/15/2012, 10:59 AM [phone]

## 2012-10-15 NOTE — Progress Notes (Signed)
Foley catheter removed at 1540 per MD order. Will monitor for patient to void. Angelena Form, RN

## 2012-10-15 NOTE — Progress Notes (Signed)
Clinical Social Worker met with pt and pt neighbor at bedside. Clinical Social Worker inquired if pt had made a decision about SNF placement. Pt stated that she was deferring the decision about SNF to pt son and asked Clinical Social Worker to contact pt son. Clinical Social Worker contacted pt son via telephone and provided bed offers. Pt son interested in pt going to rehab at [facility]. Clinical Social Worker notified facility and facility able to accept pt when pt medically stable for discharge. Clinical Child psychotherapist met with pt at bedside to discuss and pt agreeable to [facility]. Pt daughter was able to obtain copy of social security card at pt home and Clinical Social Worker sent copy into pasarr in order to obtain pasarr number. Clinical Social Worker to continue to follow and facilitate pt discharge needs when pt medically ready for discharge and pasarr received.  Jacklynn Lewis, MSW, LCSWA  Clinical Social Work [phone]

## 2012-10-15 NOTE — Consult Note (Signed)
Physical Medicine and Rehabilitation Consult Reason for Consult: Lumbar compression fracture Referring Physician: [facility]   HPI: Natalie Hicks is a 78 y.o. right-handed female with history of diabetes mellitus with peripheral neuropathy, hypertension and osteoporosis. Natalie Hicks lives alone with local family that works. Patient was admitted 10/10/2012 with history of L1 compression fracture identified July of 2013. Patient has been treated conservatively and over the past 3 days noticed increased back pain. X-rays and imaging through the emergency department revealed a new lumbar L2 compression fracture. She initially was given Valium and sent home from the emergency department. Upon arrival home patient with reported fall in the bathroom. There is also reported inability to void. MRI of lumbar spine again showed acute on chronic L1 compression fracture with greater than 50% loss of vertebral body height and minimal retropulsion as well as acute versus subacute lumbar L2 superior endplate compression fracture with less than 25% loss of vertebral body height. Patient underwent lumbar L1 and L2 kyphoplasty without complications on 10/14/2012 by interventional radiology. Hospital course with hyponatremia 123 and proved to 134 and monitored. A Foley catheter tube remains in place for urinary retention. Physical and occupational therapy have been ongoing with recommendations of physical medicine rehabilitation consult to consider inpatient rehabilitation services   Review of Systems  Musculoskeletal: Positive for back pain.  Neurological: Positive for weakness.  All other systems reviewed and are negative.   Past Medical History  Diagnosis Date  . Diabetes   . Chronic kidney disease   . Hypertension    Past Surgical History  Procedure Date  . Tonsillectomy   . Hemrhoidectomy    History reviewed. No pertinent family history. Social History:  reports that she has never smoked. She does not have  any smokeless tobacco history on file. She reports that she does not drink alcohol or use illicit drugs. Allergies:  Allergies  Allergen Reactions  . Actos (Pioglitazone) Anaphylaxis  . Avandia (Rosiglitazone) Shortness Of Breath  . Aspirin Other (See Comments)    Reaction: cramping and GI upset  . Celecoxib Other (See Comments)    Reaction: GI upset  . Penicillins Hives   Medications Prior to Admission  Medication Sig Dispense Refill  . atenolol-chlorthalidone (TENORETIC) 50-25 MG per tablet Take 1 tablet by mouth every morning.      . B Complex-C (B-COMPLEX WITH VITAMIN C) tablet Take 1 tablet by mouth daily.      . calcium carbonate (OS-CAL) 600 MG TABS Take 600 mg by mouth daily after lunch.      . cholecalciferol (VITAMIN D) 1000 UNITS tablet Take 1,000 Units by mouth daily after lunch.      . glyBURIDE-metformin (GLUCOVANCE) 2.5-500 MG per tablet Take 2 tablets by mouth 2 (two) times daily with a meal.      . losartan (COZAAR) 100 MG tablet Take 100 mg by mouth every morning.      . Multiple Vitamin (MULTIVITAMIN WITH MINERALS) TABS Take 1 tablet by mouth daily.      . pravastatin (PRAVACHOL) 20 MG tablet Take 20 mg by mouth at bedtime.      . timolol (TIMOPTIC-XR) 0.5 % ophthalmic gel-forming Place 1 drop into both eyes at bedtime.      . diazepam (VALIUM) 5 MG tablet Take 0.5 tablets (2.5 mg total) by mouth every 8 (eight) hours as needed for anxiety.  15 tablet  0    Home: Home Living Lives With: Alone Home Adaptive Equipment: Straight cane  Functional History: Prior  Function Comments: Natalie Hicks reports was ambulating with SPC however more recently unable to transfer without assistance due to pain Functional Status:  Mobility: Bed Mobility Bed Mobility: Rolling Left;Left Sidelying to Sit;Scooting to [facility];Sit to Sidelying Left Rolling Left: 1: +2 Total assist Rolling Left: Patient Percentage: 10% Left Sidelying to Sit: 1: +2 Total assist Left Sidelying to Sit: Patient  Percentage: 10% Sit to Sidelying Left: 1: +2 Total assist Sit to Sidelying Left: Patient Percentage: 10% Scooting to HOB: 1: +2 Total assist Scooting to [facility]: Patient Percentage: 0% Transfers Transfers: Not assessed Ambulation/Gait Ambulation/Gait Assistance: Not tested (comment)    ADL:    Cognition: Cognition Arousal/Alertness: Awake/alert Cognition Overall Cognitive Status: Appears within functional limits for tasks assessed/performed Arousal/Alertness: Awake/alert Orientation Level: Appears intact for tasks assessed Behavior During Session: [facility] for tasks performed  Blood pressure 151/53, pulse 74, temperature 97.9 F (36.6 C), temperature source Oral, resp. rate 16, height 5' 1.5" (1.562 m), weight 84.9 kg (187 lb 2.7 oz), SpO2 97.00%. Physical Exam  Constitutional: She is oriented to person, place, and time.  HENT:  Head: Normocephalic.  Eyes:       Pupils round and reactive to light  Neck: Neck supple. No thyromegaly present.  Pulmonary/Chest: Breath sounds normal. No respiratory distress. She has no wheezes.  Abdominal: Bowel sounds are normal. She exhibits no distension. There is no tenderness.  Musculoskeletal: She exhibits no edema.       Low back tender with truncal and proximal limb movement. Both shoulders tender with AROM adn PROM. Left 4th and 5th toes bruised, tender. Right 4th toe bruised.  Neurological: She is alert and oriented to person, place, and time.       Patient follows full commands. Stocking glove sensory loss in both feet. Strength 2-3+ in UE (pain) and 1+ to 3+ in LE also decreased somewhat due to pain. dtr's 1+. Cognitively appropriate  Psychiatric: She has a normal mood and affect.    Results for orders placed during the hospital encounter of 10/10/12 (from the past 24 hour(s))  GLUCOSE, CAPILLARY     Status: Abnormal   Collection Time   10/14/12  7:33 AM      Component Value Range   Glucose-Capillary 202 (*) 70 - 99 mg/dL   Comment 1  Notify RN    GLUCOSE, CAPILLARY     Status: Abnormal   Collection Time   10/14/12  4:49 PM      Component Value Range   Glucose-Capillary 138 (*) 70 - 99 mg/dL  GLUCOSE, CAPILLARY     Status: Abnormal   Collection Time   10/14/12 10:09 PM      Component Value Range   Glucose-Capillary 232 (*) 70 - 99 mg/dL   Ir Kyphoplasty Or Sacroplasty  10/14/2012  *RADIOLOGY REPORT*  Clinical Data/Indication: L1 AND L2 COMPRESSION FRACTURES.  LUMBAR KYPHOPLASTY  Sedation: Versed six mg, Fentanyl 300 mg.  Total Moderate Sedation Time: 60 minutes.  Vancomycin was given within two hours of incision.  Vancomycin was given due to an antibiotic allergy.  Fluoroscopy Time: 22.2 minutes.  Procedure: The procedure, risks, benefits, and alternatives were explained to the patient. Questions regarding the procedure were encouraged and answered. The patient understands and consents to the procedure.  The back was prepped with betadine in a sterile fashion, and a sterile drape was applied covering the operative field. A sterile gown and sterile gloves were used for the procedure.  1% lidocaine was utilized for local infiltration.  Under fluoroscopic guidance,  a 822 gauge spinal needle was advanced to the left L1 pedicle.  The periosteum was anesthetized with 1% lidocaine.  The 10 gauge express kyphoplasty needle was then advanced into the vertebral body via the left pedicle.  As it traversed the posterior wall, a drill was utilized to create a tract to the anterior third of the vertebral bodies.  The identical procedure was performed in the right pedicle.  The kyphoplasty balloons were then inserted and insufflated to 200 pounds per square inch.  The identical procedure was then performed at the L2 vertebral body.  The kyphoplasty balloons and L1 were removed and inserted into the L2 vertebral body.  They were insufflated.  Cement was injected into the L1 vertebral body through the trocars which were inserted through the 10 gauge  needles.  Both tracts were filled.  The balloon is in the L2 vertebral body were removed and a the trocar was utilized to inject the L2 vertebral body with cement. The trocar was removed.  The 10 gauge guide needles were removed. Final imaging was performed.  Findings: Imaging demonstrates needle placement into the L1 and L2 vertebral body via by pedicle approach.  Subsequent imaging demonstrates tract creation with the drill and subsequent balloon kyphoplasty bilaterally at L1 and L2.  Final image at L1 demonstrates contrast filling the cement extending from the inferior to superior end plate and crossing the midline.  Within L2, there is dense filling of contrast within the upper half of the vertebral body crossing the midline.  The fracture and L2 only involve the upper half of the vertebral body.  Complications: None.  IMPRESSION: Successful L1 and L2 kyphoplasty.   Original Report Authenticated By: Donavan Burnet, M.D.     Assessment/Plan: Diagnosis: L1 and L2 compression fx s/p kyphoplasties. DPN, toe fx 1. Does the need for close, 24 hr/day medical supervision in concert with the patient's rehab needs make it unreasonable for this patient to be served in a less intensive setting? Potentially 2. Co-Morbidities requiring supervision/potential complications: dm, ckd, hyponatremia 3. Due to bladder management, bowel management, safety, skin/wound care, disease management, medication administration, pain management and patient education, does the patient require 24 hr/day rehab nursing? Yes 4. Does the patient require coordinated care of a physician, rehab nurse, Natalie Hicks (1-2 hrs/day, 5 days/week) and OT (1-2 hrs/day, 5 days/week) to address physical and functional deficits in the context of the above medical diagnosis(es)? Yes and Potentially Addressing deficits in the following areas: balance, endurance, locomotion, strength, transferring, bowel/bladder control, bathing, dressing, feeding, grooming,  toileting and psychosocial support 5. Can the patient actively participate in an intensive therapy program of at least 3 hrs of therapy per day at least 5 days per week? Potentially 6. The potential for patient to make measurable gains while on inpatient rehab is good 7. Anticipated functional outcomes upon discharge from inpatient rehab are supervision to min assist with Natalie Hicks, min to mod assist with OT, n/a with SLP. 8. Estimated rehab length of stay to reach the above functional goals is: TBD 9. Does the patient have adequate social supports to accommodate these discharge functional goals? Potentially 10. Anticipated D/C setting: Home 11. Anticipated post D/C treatments: HH therapy 12. Overall Rehab/Functional Prognosis: good  RECOMMENDATIONS: This patient's condition is appropriate for continued rehabilitative care in the following setting: SNF Patient has agreed to participate in recommended program. Potentially Note that insurance prior authorization may be required for reimbursement for recommended care.  Comment: My concerns here are her activity  tolerance/pain, long term safety, balance issues, and dispo. She has intermittent help at home and likely would need physical assistance with ADL's and mobility once discharged from CIR. Probably would be better served in an environment which is less intensive, would allow her further healing time, and would allow the family to make long term decisions regarding her care needs. Rehab RN to follow up.   Ivory Broad, MD     10/15/2012

## 2012-10-15 NOTE — Evaluation (Signed)
Occupational Therapy Evaluation Patient Details Name: Natalie Hicks MRN: [id number] DOB: [birth year] Today's Date: 10/15/2012 Time: 4098-1191 OT Time Calculation (min): 32 min  OT Assessment / Plan / Recommendation Clinical Impression  Pt day 1 s/p kyphoplasty. Pt limited by significant pain. +2 A needed for all mobility. Skilled OT recommended to maximize independence with BADLs to mod A level in prep for d/c to next venue of care.    OT Assessment  Patient needs continued OT Services    Follow Up Recommendations  Skilled nursing facility    Barriers to Discharge Decreased caregiver support;Inaccessible home environment    Equipment Recommendations  Rolling walker with 5" wheels;3 in 1 bedside comode    Recommendations for Other Services    Frequency  Min 2X/week    Precautions / Restrictions Precautions Precautions: Fall;Back Restrictions Weight Bearing Restrictions: No   Pertinent Vitals/Pain Reported 10/10 back and hip pain with mobility. RN made aware and repositioned for comfort.    ADL  Grooming: Simulated;Minimal assistance Where Assessed - Grooming: Unsupported sitting Toilet Transfer: Simulated;+2 Total assistance Toilet Transfer: Patient Percentage: 50% Toilet Transfer Method: Sit to stand Toileting - Architect and Hygiene: Simulated;+2 Total assistance Toileting - Clothing Manipulation and Hygiene: Patient Percentage: 50% Where Assessed - Toileting Clothing Manipulation and Hygiene: Standing Transfers/Ambulation Related to ADLs: Pt very fearful of falling, still with significant pain and did not want to transfer to chair. Max encouragement needed to stand at EOB. ADL Comments: Pt educated in all back precautions.    OT Diagnosis: Generalized weakness  OT Problem List: Decreased activity tolerance;Decreased strength;Pain;Decreased knowledge of use of DME or AE OT Treatment Interventions: Self-care/ADL training;Therapeutic activities;DME and/or  AE instruction;Patient/family education   OT Goals Acute Rehab OT Goals OT Goal Formulation: With patient Time For Goal Achievement: 10/29/12 Potential to Achieve Goals: Good ADL Goals Pt Will Perform Grooming: with set-up;Sitting, chair;Sitting, edge of bed;Unsupported (x 3 tasks.) ADL Goal: Grooming - Progress: Goal set today Pt Will Transfer to Toilet: with mod assist;Stand pivot transfer;3-in-1;Maintaining back safety precautions ADL Goal: Toilet Transfer - Progress: Goal set today Pt Will Perform Toileting - Clothing Manipulation: with mod assist;Sitting on 3-in-1 or toilet;Standing ADL Goal: Toileting - Clothing Manipulation - Progress: Goal set today Pt Will Perform Toileting - Hygiene: with mod assist;Sit to stand from 3-in-1/toilet ADL Goal: Toileting - Hygiene - Progress: Goal set today  Visit Information  Last OT Received On: 10/15/12 Assistance Needed: +2 PT/OT Co-Evaluation/Treatment: Yes    Subjective Data  Subjective: Oh. Im so afraid of falling! Patient Stated Goal: Rehab following hospitalization.   Prior Functioning     Home Living Lives With: Alone Available Help at Discharge: Skilled Nursing Facility Home Adaptive Equipment: Straight cane Additional Comments: Pt discharging to snf. Prior Function Level of Independence: Independent with assistive device(s) Able to Take Stairs?: Yes Driving: No Communication Communication: No difficulties Dominant Hand: Left         Vision/Perception     Cognition  Overall Cognitive Status: Appears within functional limits for tasks assessed/performed Arousal/Alertness: Awake/alert Orientation Level: Appears intact for tasks assessed Behavior During Session: [facility] for tasks performed    Extremity/Trunk Assessment Right Upper Extremity Assessment RUE ROM/Strength/Tone: Deficits RUE ROM/Strength/Tone Deficits: 3+/5 per functional observation Left Upper Extremity Assessment LUE ROM/Strength/Tone: Deficits LUE  ROM/Strength/Tone Deficits: 3+/5 per functional observation     Mobility Bed Mobility Bed Mobility: Rolling Left;Left Sidelying to Sit;Scooting to HOB;Sit to Sidelying Left Rolling Left: 1: +2 Total assist Rolling Left: Patient Percentage: 10%  Left Sidelying to Sit: 1: +2 Total assist Left Sidelying to Sit: Patient Percentage: 10% Sit to Sidelying Left: 1: +2 Total assist Sit to Sidelying Left: Patient Percentage: 10% Scooting to HOB: 1: +2 Total assist Scooting to [facility]: Patient Percentage: 0% Details for Bed Mobility Assistance: pt educated on log roll technique to decrease pain with transfers, increased assist due to pain, increased time to perform Transfers Sit to Stand: 4: Min assist;With upper extremity assist Stand to Sit: 4: Min assist;With upper extremity assist Details for Transfer Assistance: pt 75%, VCs hand placement; pt stood with RW for 1 minute with heavy use of BUEs, tolerance limited by pain, pt unable to weight shift to attempt taking a step     Shoulder Instructions     Exercise     Balance Balance Balance Assessed: Yes Static Sitting Balance Static Sitting - Balance Support: Bilateral upper extremity supported;Feet unsupported Static Sitting - Level of Assistance: 5: Stand by assistance;4: Min assist Static Sitting - Comment/# of Minutes: 5 minutes at EOB, initially required min A for balance, then SBA   End of Session OT - End of Session Activity Tolerance: Patient limited by pain Patient left: in bed;with call bell/phone within reach  GO     Brylea Pita A OTR/L [phone] 10/15/2012, 1:50 PM

## 2012-10-16 LAB — CBC
MCH: 29 pg (ref 26.0–34.0)
MCV: 88.8 fL (ref 78.0–100.0)
Platelets: 312 10*3/uL (ref 150–400)
RDW: 13.9 % (ref 11.5–15.5)
WBC: 9.8 10*3/uL (ref 4.0–10.5)

## 2012-10-16 LAB — BASIC METABOLIC PANEL
BUN: 18 mg/dL (ref 6–23)
Calcium: 8.6 mg/dL (ref 8.4–10.5)
GFR calc non Af Amer: 78 mL/min — ABNORMAL LOW (ref >90)
Glucose, Bld: 136 mg/dL — ABNORMAL HIGH (ref 70–99)
Sodium: 130 mEq/L — ABNORMAL LOW (ref 135–145)

## 2012-10-16 LAB — URINE MICROSCOPIC-ADD ON

## 2012-10-16 LAB — URINALYSIS, ROUTINE W REFLEX MICROSCOPIC
Bilirubin Urine: NEGATIVE
Ketones, ur: NEGATIVE mg/dL
Nitrite: POSITIVE — AB
Urobilinogen, UA: 1 mg/dL (ref 0.0–1.0)

## 2012-10-16 MED ORDER — HYDROCODONE-ACETAMINOPHEN 5-325 MG PO TABS: 0.5000 | ORAL_TABLET | Freq: Four times a day (QID) | ORAL | Status: DC | PRN

## 2012-10-16 MED ORDER — ATENOLOL 50 MG PO TABS: 50.0000 mg | ORAL_TABLET | Freq: Every day | ORAL | Status: DC

## 2012-10-16 MED ORDER — DIAZEPAM 5 MG PO TABS: 2.5000 mg | ORAL_TABLET | Freq: Three times a day (TID) | ORAL | Status: DC | PRN

## 2012-10-16 MED ORDER — TAMSULOSIN HCL 0.4 MG PO CAPS: 0.4000 mg | ORAL_CAPSULE | Freq: Every day | ORAL | Status: DC

## 2012-10-16 MED ORDER — SACCHAROMYCES BOULARDII 250 MG PO CAPS
250.0000 mg | ORAL_CAPSULE | Freq: Two times a day (BID) | ORAL | Status: DC
  Filled 2012-10-16 (×2): qty 1

## 2012-10-16 MED ORDER — CIPROFLOXACIN HCL 500 MG PO TABS
500.0000 mg | ORAL_TABLET | Freq: Two times a day (BID) | ORAL | Status: DC
  Administered 2012-10-16: 500 mg via ORAL
  Filled 2012-10-16 (×3): qty 1

## 2012-10-16 MED ORDER — POLYETHYLENE GLYCOL 3350 17 G PO PACK: 17.0000 g | PACK | Freq: Every day | ORAL | Status: DC

## 2012-10-16 MED ORDER — CIPROFLOXACIN HCL 500 MG PO TABS
500.0000 mg | ORAL_TABLET | Freq: Two times a day (BID) | ORAL | Status: DC
  Filled 2012-10-16 (×3): qty 1

## 2012-10-16 MED ORDER — GLUCERNA SHAKE PO LIQD: 237.0000 mL | Freq: Two times a day (BID) | ORAL | Status: DC

## 2012-10-16 MED ORDER — CIPROFLOXACIN HCL 500 MG PO TABS
500.0000 mg | ORAL_TABLET | Freq: Two times a day (BID) | ORAL | Status: DC
  Filled 2012-10-16 (×2): qty 1

## 2012-10-16 MED ORDER — ACETAMINOPHEN 325 MG PO TABS: 650.0000 mg | ORAL_TABLET | Freq: Four times a day (QID) | ORAL | Status: DC | PRN

## 2012-10-16 MED ORDER — SACCHAROMYCES BOULARDII 250 MG PO CAPS: 250.0000 mg | ORAL_CAPSULE | Freq: Two times a day (BID) | ORAL | Status: DC

## 2012-10-16 MED ORDER — CIPROFLOXACIN HCL 500 MG PO TABS: 500.0000 mg | ORAL_TABLET | Freq: Two times a day (BID) | ORAL | Status: AC

## 2012-10-16 MED ORDER — SENNOSIDES-DOCUSATE SODIUM 8.6-50 MG PO TABS: 1.0000 | ORAL_TABLET | Freq: Every evening | ORAL | Status: DC | PRN

## 2012-10-16 NOTE — Progress Notes (Signed)
Pt c/o abd discomfort and showed bladder distention. Bladder scan only showed 89ml. I&O cath done. 500ml of clear, yellow urine emptied. Will continue to monitor and wait for pt to void independently. 

## 2012-10-16 NOTE — Progress Notes (Signed)
Subjective: Patient's back pain is improved with vicodine. Still had urinary retention requiring foley cath placement.   Objective: Vital signs in last 24 hours: Filed Vitals:   10/15/12 0539 10/15/12 1500 10/15/12 2220 10/16/12 0600  BP: 151/53 146/62 119/49 141/52  Pulse: 74 71 66 72  Temp: 97.9 F (36.6 C) 98.1 F (36.7 C) 98.5 F (36.9 C) 98.5 F (36.9 C)  TempSrc: Oral  Oral Oral  Resp: 16 20 18 20   Height:      Weight:      SpO2: 97% 100% 95% 96%   Weight change:   Intake/Output Summary (Last 24 hours) at 10/16/12 0937 Last data filed at 10/16/12 0445  Gross per 24 hour  Intake    840 ml  Output   1801 ml  Net   -961 ml    Physical Exam: General: Awake, Oriented, No acute distress. HEENT: EOMI. Neck: Supple CV: S1 and S2 Lungs: Clear to ascultation bilaterally Abdomen: Soft, Nontender, Nondistended, +bowel sounds. Ext: Good pulses. Trace edema.  Lab Results: Basic Metabolic Panel:  Lab 10/16/12 1610 10/13/12 0340 10/11/12 0358 10/10/12 1735  NA 130* 134* 127* 123*  K 4.5 3.6 3.7 3.9  CL 95* 98 91* 86*  CO2 26 27 28 28   GLUCOSE 136* 123* 71 106*  BUN 18 15 20  24*  CREATININE 0.67 0.65 0.66 0.68  CALCIUM 8.6 8.8 8.9 9.6  MG -- -- -- --  PHOS -- -- -- --   Liver Function Tests:  Lab 10/11/12 0358  AST 17  ALT 7  ALKPHOS 46  BILITOT 0.3  PROT 5.8*  ALBUMIN 2.6*   No results found for this basename: LIPASE:5,AMYLASE:5 in the last 168 hours No results found for this basename: AMMONIA:5 in the last 168 hours CBC:  Lab 10/16/12 0630 10/10/12 1735  WBC 9.8 9.4  NEUTROABS -- --  HGB 9.8* 10.8*  HCT 30.0* 32.4*  MCV 88.8 85.5  PLT 312 361   Cardiac Enzymes: No results found for this basename: CKTOTAL:5,CKMB:5,CKMBINDEX:5,TROPONINI:5 in the last 168 hours BNP (last 3 results) No results found for this basename: PROBNP:3 in the last 8760 hours CBG:  Lab 10/15/12 2148 10/15/12 1652 10/15/12 1158 10/15/12 0735 10/14/12 2209  GLUCAP 92 127*  146* 170* 232*   No results found for this basename: HGBA1C:5 in the last 72 hours Other Labs: No components found with this basename: POCBNP:3 No results found for this basename: DDIMER:2 in the last 168 hours No results found for this basename: CHOL:2,HDL:2,LDLCALC:2,TRIG:2,CHOLHDL:2,LDLDIRECT:2 in the last 168 hours No results found for this basename: TSH,T4TOTAL,FREET3,T3FREE,FREET4,THYROIDAB in the last 168 hours No results found for this basename: VITAMINB12:2,FOLATE:2,FERRITIN:2,TIBC:2,IRON:2,RETICCTPCT:2 in the last 168 hours  Micro Results: Recent Results (from the past 240 hour(s))  URINE CULTURE     Status: Normal   Collection Time   10/10/12  5:19 PM      Component Value Range Status Comment   Specimen Description URINE, CATHETERIZED   Final    Special Requests NONE   Final    Culture  Setup Time 10/11/2012 01:42   Final    Colony Count NO GROWTH   Final    Culture NO GROWTH   Final    Report Status 10/12/2012 FINAL   Final     Studies/Results: Ir Kyphoplasty Or Sacroplasty  10/14/2012  *RADIOLOGY REPORT*  Clinical Data/Indication: L1 AND L2 COMPRESSION FRACTURES.  LUMBAR KYPHOPLASTY  Sedation: Versed six mg, Fentanyl 300 mg.  Total Moderate Sedation Time: 60 minutes.  Vancomycin  was given within two hours of incision.  Vancomycin was given due to an antibiotic allergy.  Fluoroscopy Time: 22.2 minutes.  Procedure: The procedure, risks, benefits, and alternatives were explained to the patient. Questions regarding the procedure were encouraged and answered. The patient understands and consents to the procedure.  The back was prepped with betadine in a sterile fashion, and a sterile drape was applied covering the operative field. A sterile gown and sterile gloves were used for the procedure.  1% lidocaine was utilized for local infiltration.  Under fluoroscopic guidance, a 822 gauge spinal needle was advanced to the left L1 pedicle.  The periosteum was anesthetized with 1%  lidocaine.  The 10 gauge express kyphoplasty needle was then advanced into the vertebral body via the left pedicle.  As it traversed the posterior wall, a drill was utilized to create a tract to the anterior third of the vertebral bodies.  The identical procedure was performed in the right pedicle.  The kyphoplasty balloons were then inserted and insufflated to 200 pounds per square inch.  The identical procedure was then performed at the L2 vertebral body.  The kyphoplasty balloons and L1 were removed and inserted into the L2 vertebral body.  They were insufflated.  Cement was injected into the L1 vertebral body through the trocars which were inserted through the 10 gauge needles.  Both tracts were filled.  The balloon is in the L2 vertebral body were removed and a the trocar was utilized to inject the L2 vertebral body with cement. The trocar was removed.  The 10 gauge guide needles were removed. Final imaging was performed.  Findings: Imaging demonstrates needle placement into the L1 and L2 vertebral body via by pedicle approach.  Subsequent imaging demonstrates tract creation with the drill and subsequent balloon kyphoplasty bilaterally at L1 and L2.  Final image at L1 demonstrates contrast filling the cement extending from the inferior to superior end plate and crossing the midline.  Within L2, there is dense filling of contrast within the upper half of the vertebral body crossing the midline.  The fracture and L2 only involve the upper half of the vertebral body.  Complications: None.  IMPRESSION: Successful L1 and L2 kyphoplasty.   Original Report Authenticated By: Donavan Burnet, M.D.     Medications: I have reviewed the patient's current medications. Scheduled Meds:    . atenolol  50 mg Oral Daily  . B-complex with vitamin C  1 tablet Oral Daily  . calcium carbonate  1 tablet Oral Q1200  . cholecalciferol  1,000 Units Oral Q1200  . ciprofloxacin  500 mg Oral BID  . feeding supplement  237 mL  Oral QHS  . insulin aspart  0-15 Units Subcutaneous TID WC  . insulin aspart  4 Units Subcutaneous TID WC  . losartan  100 mg Oral q morning - 10a  . multivitamin with minerals  1 tablet Oral Daily  . polyethylene glycol  17 g Oral Daily  . simvastatin  5 mg Oral q1800  . Tamsulosin HCl  0.4 mg Oral Daily  . timolol  1 drop Both Eyes QHS  . DISCONTD: hydrochlorothiazide  25 mg Oral Daily   Continuous Infusions:    . DISCONTD: sodium chloride 75 mL/hr at 10/15/12 1528   PRN Meds:.acetaminophen, acetaminophen, guaiFENesin, HYDROcodone-acetaminophen, naloxone (NARCAN) injection, ondansetron (ZOFRAN) IV, ondansetron, senna-docusate, DISCONTD:  morphine injection  Assessment/Plan: Back Pain/acute on chronic L1 Compression fracture with acute or subacute L2 superior endplate compression fracture on MRI of  L-spine on 10/12/2012  Patient is status post kyphoplasty on 10/14/2012, pain improved but still having significant pain.  Continue to work with physical therapy.  Transition IV morphine to oral hydrocodone.  Urinary Retention  Likely due to pain medications.  Discontinued Foley catheter and started Flomax on 10/15/2012.  Patient failed voiding trial and required foley cath placement back on 10/15/2012.  Will need outpatient voiding trails in about 3-4 days.  Questionable UTI Initial urine analysis and culture on 10/10/2012 was negative and no growth to date. Repeat urine analysis on 10/16/2012 was positive for nitrate and leukocytes, likely due reactive to foley cath rather than a UTI. However given kyphoplasty will treat empirically with ciprofloxacin for a total of 5 days.  Fall  Suspect related to pain from compression fracture and somnolence from pain meds. PT is recommending SNF at DC.   Hyponatremia  Resolved with IVF. Suspect related to mild dehydration and decreased PO intake.  Discontinued hydrochlorothiazide, as hydrochlorothiazide in the elderly could be contributing to  hyponatremia.  Sodium stable, patient notes that she has had hyponatremia for a while now. Now that HCTZ has been discontinued will need repeat lab work in 1 week.  DM  CBGs with fair control.  Continue NovoLog 3 times a day with meals.  Sliding scale insulin. Resume home medications of metformin/gliburide at discharge.  Anemia Normocytic. Etiology unclear. Will need anemia panel and further evaluation as outpatient.  DVT Prophylaxis  Lovenox.   Disposition  Plan for SNF, patient not appropriate for inpatient rehab.    LOS: 6 days  Lynell Greenhouse A, MD 10/16/2012, 9:37 AM

## 2012-10-16 NOTE — Progress Notes (Signed)
Clinical Social Worker facilitated pt discharge needs including contacting facility, faxing pt discharge information via [facility], discussing with pt and pt daughter at bedside and notifying pt son via telephone, providing RN phone number to call report to [employer], and arranging ambulance transportation for pt to [facility]. No further social work needs identified at this time. Clinical Social Worker signing off.   Jacklynn Lewis, MSW, LCSWA  Clinical Social Work [phone]

## 2012-10-16 NOTE — Progress Notes (Signed)
Pt c/o abd discomfort. Pt was incontinent of some urine. Bladder scan revealed . Donnamarie Poag, NP, notified and an order was given to replace Foley. 16 Fr. Foley catheter inserted. Pt immediately put out of cloudy, milky urine with sediment. Pt tolerated the catheter inserted well.

## 2012-10-16 NOTE — Discharge Summary (Addendum)
Physician Discharge Summary  Natalie Hicks:[id number] DOB: Sep 06, 1925 DOA: 10/10/2012  PCP: Lupita Raider, MD  Admit date: 10/10/2012 Discharge date: 10/16/2012  Recommendations for Outpatient Follow-up:  Followup with Lupita Raider, MD (PCP) in 1 week. Will need CBC and BMET in 1 week. If patient is hyponatremic or anemic will need further workup. Will need to have a voiding trial in 3-4 days to see if foley cath can come out once patient is more ambulatory.  Discharge Diagnoses:  Principal Problem:  *Lumbar compression fracture Active Problems:  Back pain  Urinary retention  DM (diabetes mellitus)  Dehydration  CKD (chronic kidney disease)  Hyponatremia  Discharge Condition: Stable  Diet recommendation: Diabetic diet  Filed Weights   10/10/12 1809  Weight: 84.9 kg (187 lb 2.7 oz)    History of present illness:  Pleasant 78 y/o white woman with PMH significant for DM, CKD (?stage) and osteoporosis. In July she spontaneously had back pain and was found to have an L1 compression fracture. Was treated with PO pain meds and eventually subsided. About 3 days ago she bent to get some clothes out of the washing machine and suddenly had severe back pain and presented to the ED on 10/10/2012.  Hospital Course:  Back Pain/acute on chronic L1 Compression fracture with acute or subacute L2 superior endplate compression fracture on MRI of L-spine on 10/12/2012 due to osteoporosis  Patient is status post kyphoplasty on 10/14/2012, pain improved but still having significant pain.  Continue to work with physical therapy.  Transition IV morphine to oral hydrocodone. Patient to continue calcium and vitamin D for osteoporosis.  Urinary Retention  Likely due to pain medications.  Discontinued Foley catheter and started Flomax on 10/15/2012.  Patient failed voiding trial and required foley cath placement back on 10/15/2012.  Will need outpatient voiding trails in about 3-4  days.  Questionable UTI Initial urine analysis and culture on 10/10/2012 was negative and no growth to date. Repeat urine analysis on 10/16/2012 was positive for nitrate and leukocytes, likely due reactive to foley cath rather than a UTI. However given kyphoplasty will treat empirically with ciprofloxacin for a total of 5 days.  Fall  Suspect related to pain from compression fracture and somnolence from pain meds. PT is recommending SNF at DC.   Hyponatremia  Resolved with IVF. Suspect related to mild dehydration and decreased PO intake.  Discontinued hydrochlorothiazide, as hydrochlorothiazide in the elderly could be contributing to hyponatremia.  Sodium stable, patient notes that she has had hyponatremia for a while now. Now that HCTZ has been discontinued will need repeat lab work in 1 week. Patient at home was on chlorathalidone which can also cause hyponatremia, for now cholorathalidone will be discontinued.  DM  CBGs with fair control.  Continue NovoLog 3 times a day with meals.  Sliding scale insulin. Resume home medications of metformin/gliburide at discharge.  Anemia Normocytic. Etiology unclear. Will need anemia panel and further evaluation as outpatient.  Procedures:  As above.   Consultations:  IR  Discharge Exam: Filed Vitals:   10/15/12 0539 10/15/12 1500 10/15/12 2220 10/16/12 0600  BP: 151/53 146/62 119/49 141/52  Pulse: 74 71 66 72  Temp: 97.9 F (36.6 C) 98.1 F (36.7 C) 98.5 F (36.9 C) 98.5 F (36.9 C)  TempSrc: Oral  Oral Oral  Resp: 16 20 18 20   Height:      Weight:      SpO2: 97% 100% 95% 96%   Discharge Instructions  Discharge Orders  Future Orders Please Complete By Expires   Diet Carb Modified      Increase activity slowly      Discharge instructions      Comments:   Followup with Lupita Raider, MD (PCP) in 1 week. Will need CBC and BMET in 1 week. If patient is hyponatremic or anemic will need further workup. Will need to have a voiding  trial in 3-4 days to see if foley cath can come out once patient is more ambulatory.       Medication List     As of 10/16/2012 11:00 AM    STOP taking these medications         atenolol-chlorthalidone 50-25 MG per tablet   Commonly known as: TENORETIC      TAKE these medications         acetaminophen 325 MG tablet   Commonly known as: TYLENOL   Take 2 tablets (650 mg total) by mouth every 6 (six) hours as needed (or Fever >/= 101).      atenolol 50 MG tablet   Commonly known as: TENORMIN   Take 1 tablet (50 mg total) by mouth daily.      B-complex with vitamin C tablet   Take 1 tablet by mouth daily.      calcium carbonate 600 MG Tabs   Commonly known as: OS-CAL   Take 600 mg by mouth daily after lunch.      cholecalciferol 1000 UNITS tablet   Commonly known as: VITAMIN D   Take 1,000 Units by mouth daily after lunch.      ciprofloxacin 500 MG tablet   Commonly known as: CIPRO   Take 1 tablet (500 mg total) by mouth 2 (two) times daily. Discontinue after 10/20/2012.      diazepam 5 MG tablet   Commonly known as: VALIUM   Take 0.5 tablets (2.5 mg total) by mouth every 8 (eight) hours as needed for anxiety.      feeding supplement Liqd   Take 237 mLs by mouth 2 (two) times daily between meals.      glyBURIDE-metformin 2.5-500 MG per tablet   Commonly known as: GLUCOVANCE   Take 2 tablets by mouth 2 (two) times daily with a meal.      HYDROcodone-acetaminophen 5-325 MG per tablet   Commonly known as: NORCO/VICODIN   Take 0.5-1 tablets by mouth every 6 (six) hours as needed.      losartan 100 MG tablet   Commonly known as: COZAAR   Take 100 mg by mouth every morning.      multivitamin with minerals Tabs   Take 1 tablet by mouth daily.      polyethylene glycol packet   Commonly known as: MIRALAX / GLYCOLAX   Take 17 g by mouth daily.      pravastatin 20 MG tablet   Commonly known as: PRAVACHOL   Take 20 mg by mouth at bedtime.      saccharomyces  boulardii 250 MG capsule   Commonly known as: FLORASTOR   Take 1 capsule (250 mg total) by mouth 2 (two) times daily. For 2 weeks, if no diarrhea then discontinue.      senna-docusate 8.6-50 MG per tablet   Commonly known as: Senokot-S   Take 1 tablet by mouth at bedtime as needed.      Tamsulosin HCl 0.4 MG Caps   Commonly known as: FLOMAX   Take 1 capsule (0.4 mg total) by mouth daily.  timolol 0.5 % ophthalmic gel-forming   Commonly known as: TIMOPTIC-XR   Place 1 drop into both eyes at bedtime.           The results of significant diagnostics from this hospitalization (including imaging, microbiology, ancillary and laboratory) are listed below for reference.    Significant Diagnostic Studies: Dg Lumbar Spine Complete  10/06/2012  *RADIOLOGY REPORT*  Clinical Data: Back pain.  Compression fracture.  LUMBAR SPINE - COMPLETE 4+ VIEW  Comparison: 07/09/2012.  Findings: Alignment is anatomic.  Progression of L1 compression fracture.  New L2 superior endplate compression fracture.  Mild multilevel endplate degenerative changes.  Facet hypertrophy in the lower lumbar spine.  Osteopenia.  Atherosclerotic calcification of the arterial vasculature.  Scoliosis.  IMPRESSION:  1.  Interval progression of L1 compression fracture. 2.  New L2 superior endplate compression fracture. 3. Per CMS PQRS reporting requirements (PQRS Measure 24): Given the patient's age of greater than 50 and the fracture site (hip, distal radius, or spine), the patient should be tested for osteoporosis using DXA, and the appropriate treatment considered based on the DXA results. 4.  Spondylosis.   Original Report Authenticated By: Reyes Ivan, M.D.    Mr Lumbar Spine Wo Contrast  10/12/2012  *RADIOLOGY REPORT*  Clinical Data: Lumbar compression fracture.  Urinary retention. Radicular symptoms.  MRI LUMBAR SPINE WITHOUT CONTRAST  Technique:  Multiplanar and multiecho pulse sequences of the lumbar spine were  obtained without intravenous contrast.  Comparison: Radiographs 10/06/2012.  Findings: Lumbosacral transitional anatomy is present.  Prominent S1-S2 disc space.  Alignment shows grade 1 anterolisthesis of L4 on L5 and L5 on S1, measuring 3 mm in both levels.  Multilevel degenerative disease is present.  Paraspinal soft tissues demonstrate fluid attenuation in the right costophrenic sulcus suggesting a small pleural effusion.  The spinal cord terminates posterior to the L2 vertebra.  There is a mild levoconvex curvature of the lumbar spine with the apex at L4-L5.  Degenerative endplate changes are present at T11-T12.  Scattered areas of degenerative endplate changes are present. Tiny focus of bone marrow edema is present in the left sacral ala compatible with reactive/degenerative edema associated with the left SI joint.  Acute or subacute L1 and L2 compression fractures are present.  The L1 compression fracture shows slightly greater than 50% loss of anterior vertebral body height.  Minimal retropulsion of L1.  L2 compression fracture shows less than 25% loss of vertebral body height. The loss of height appears less severe than on the standing upright radiographs and there is heterogeneous signal in the superior half of the vertebral body, most compatible with hemorrhage into the fracture cleavage plane.  There is mild concavity of the T12 superior endplate however based on degenerative changes, this either represents a prominent Schmorl's node or an old compression fracture.  T10-T11:  Disc degeneration and desiccation with minimal bulging. No stenosis.  T11-T12:  Disc degeneration.  Degenerative fluid signal within the disc space.  Infection considered unlikely.  Mild central stenosis associated with broad-based posterior disc bulging, endplate osteophytes and posterior ligamentum flavum redundancy and facet hypertrophy.  Foramina appear adequately patent.  T12-L1:  Disc desiccation.  Facet hypertrophy.  No  stenosis.  L1-L2:  2 mm of retropulsion of the inferior L1 vertebra.  Very mild central stenosis.  No cord deformity.  Foramina appear adequately patent.  L2-L3:  Negative.  L3-L4: Mild disc desiccation.  No stenosis.  L4-L5: Mild to moderate central stenosis associated with anterolisthesis, shallow broad-based disc  bulging and facet hypertrophy/ligamentum flavum redundancy.  The anterolisthesis appears degenerative, associated with facet arthrosis.  Crowding of both lateral recesses.  Foramina adequately patent.  L5-S1:  Mild to moderate central stenosis associated anterolisthesis, broad-based posterior disc bulge and posterior ligamentum flavum redundancy.  There is narrowing of both lateral recesses potentially affecting the descending S1 nerves.  Moderate bilateral foraminal stenosis is multifactorial.  IMPRESSION: 1.  Acute on chronic L1 compression fracture with greater 50% loss vertebral body height and minimal retropulsion. 2.  Acute or subacute L2 superior endplate compression fracture with less than 25% loss of vertebral body height on supine positioning.  The changes in height between radiographs and MRI suggest recent fracture and signal in the vertebral body is compatible with hemorrhage into the fracture cleavage plane.  Favor benign osteoporotic compression fracture; underlying osseous lesion considered unlikely.  If vertebral augmentation is elected, consider biopsy. 3.  Lumbar spondylosis most pronounced at L4-L5 and L5-S1 as described above.   Original Report Authenticated By: Andreas Newport, M.D.    Ir Kyphoplasty Or Sacroplasty  10/14/2012  *RADIOLOGY REPORT*  Clinical Data/Indication: L1 AND L2 COMPRESSION FRACTURES.  LUMBAR KYPHOPLASTY  Sedation: Versed six mg, Fentanyl 300 mg.  Total Moderate Sedation Time: 60 minutes.  Vancomycin was given within two hours of incision.  Vancomycin was given due to an antibiotic allergy.  Fluoroscopy Time: 22.2 minutes.  Procedure: The procedure, risks,  benefits, and alternatives were explained to the patient. Questions regarding the procedure were encouraged and answered. The patient understands and consents to the procedure.  The back was prepped with betadine in a sterile fashion, and a sterile drape was applied covering the operative field. A sterile gown and sterile gloves were used for the procedure.  1% lidocaine was utilized for local infiltration.  Under fluoroscopic guidance, a 822 gauge spinal needle was advanced to the left L1 pedicle.  The periosteum was anesthetized with 1% lidocaine.  The 10 gauge express kyphoplasty needle was then advanced into the vertebral body via the left pedicle.  As it traversed the posterior wall, a drill was utilized to create a tract to the anterior third of the vertebral bodies.  The identical procedure was performed in the right pedicle.  The kyphoplasty balloons were then inserted and insufflated to 200 pounds per square inch.  The identical procedure was then performed at the L2 vertebral body.  The kyphoplasty balloons and L1 were removed and inserted into the L2 vertebral body.  They were insufflated.  Cement was injected into the L1 vertebral body through the trocars which were inserted through the 10 gauge needles.  Both tracts were filled.  The balloon is in the L2 vertebral body were removed and a the trocar was utilized to inject the L2 vertebral body with cement. The trocar was removed.  The 10 gauge guide needles were removed. Final imaging was performed.  Findings: Imaging demonstrates needle placement into the L1 and L2 vertebral body via by pedicle approach.  Subsequent imaging demonstrates tract creation with the drill and subsequent balloon kyphoplasty bilaterally at L1 and L2.  Final image at L1 demonstrates contrast filling the cement extending from the inferior to superior end plate and crossing the midline.  Within L2, there is dense filling of contrast within the upper half of the vertebral body  crossing the midline.  The fracture and L2 only involve the upper half of the vertebral body.  Complications: None.  IMPRESSION: Successful L1 and L2 kyphoplasty.   Original Report Authenticated  By: Donavan Burnet, M.D.    Dg Finger Ring Left  09/16/2012  *RADIOLOGY REPORT*  Clinical Data: Swelling of the left fourth PIP joint, no injury  LEFT RING FINGER 2+V  Comparison: None.  Findings: There is significant degenerative change involving the left fourth PIP joint and DIP joint.  No erosion is seen.  There are faint calcifications adjacent to the left fourth PIP joint which may related to arthritis or possibly connective tissue disease.  IMPRESSION: Significant degenerative change involves the left fourth PIP and DIP joints.  No erosion or fracture.   Original Report Authenticated By: Juline Patch, M.D.    Dg Foot Complete Left  10/11/2012  *RADIOLOGY REPORT*  Clinical Data: Bruised and swollen left fourth and fifth toes after a fall.  LEFT FOOT - COMPLETE 3+ VIEW  Comparison: No priors.  Findings: There is a mild irregularity of the trabecular markings in the base of the fifth proximal phalanx, which could suggest the presence of a nondisplaced fracture.  No other definite displaced fracture or dislocation is noted.  There are multifocal degenerative changes of osteoarthritis with joint space narrowing, subchondral sclerosis, subchondral cyst formation and osteophyte formation throughout the DIP, PIP joints and midfoot.  Similar changes are also noted at the second MTP joint.  IMPRESSION: 1.  Possible nondisplaced fracture through the base of the fifth proximal phalanx. 2.  Advanced degenerative changes of osteoarthritis.   Original Report Authenticated By: Florencia Reasons, M.D.     Microbiology: Recent Results (from the past 240 hour(s))  URINE CULTURE     Status: Normal   Collection Time   10/10/12  5:19 PM      Component Value Range Status Comment   Specimen Description URINE, CATHETERIZED    Final    Special Requests NONE   Final    Culture  Setup Time 10/11/2012 01:42   Final    Colony Count NO GROWTH   Final    Culture NO GROWTH   Final    Report Status 10/12/2012 FINAL   Final      Labs: Basic Metabolic Panel:  Lab 10/16/12 4098 10/13/12 0340 10/11/12 0358 10/10/12 1735  NA 130* 134* 127* 123*  K 4.5 3.6 3.7 3.9  CL 95* 98 91* 86*  CO2 26 27 28 28   GLUCOSE 136* 123* 71 106*  BUN 18 15 20  24*  CREATININE 0.67 0.65 0.66 0.68  CALCIUM 8.6 8.8 8.9 9.6  MG -- -- -- --  PHOS -- -- -- --   Liver Function Tests:  Lab 10/11/12 0358  AST 17  ALT 7  ALKPHOS 46  BILITOT 0.3  PROT 5.8*  ALBUMIN 2.6*   No results found for this basename: LIPASE:5,AMYLASE:5 in the last 168 hours No results found for this basename: AMMONIA:5 in the last 168 hours CBC:  Lab 10/16/12 0630 10/10/12 1735  WBC 9.8 9.4  NEUTROABS -- --  HGB 9.8* 10.8*  HCT 30.0* 32.4*  MCV 88.8 85.5  PLT 312 361   Cardiac Enzymes: No results found for this basename: CKTOTAL:5,CKMB:5,CKMBINDEX:5,TROPONINI:5 in the last 168 hours BNP: BNP (last 3 results) No results found for this basename: PROBNP:3 in the last 8760 hours CBG:  Lab 10/15/12 2148 10/15/12 1652 10/15/12 1158 10/15/12 0735 10/14/12 2209  GLUCAP 92 127* 146* 170* 232*    Time coordinating discharge: 35 minutes  Signed:  Lyzette Reinhardt A  [facility] 10/16/2012, 9:53 AM

## 2012-10-16 NOTE — Progress Notes (Signed)
Patient discharged per MD. D/C instructions reviewed with patient and pt's daughter, Jeanice Lim, at bedside. Patient signed D/C instructions and [facility] came and picked up the patient for transport to SNF. Two prescriptions were placed in the folder to be given to the facility. Angelena Form, RN

## 2012-10-16 NOTE — Progress Notes (Deleted)
Pt c/o abd discomfort and showed bladder distention. Bladder scan only showed 89ml. I&O cath done. of clear, yellow urine emptied. Will continue to monitor and wait for pt to void independently.

## 2012-10-17 LAB — URINE CULTURE

## 2012-10-17 NOTE — ED Provider Notes (Signed)
History    87yf with lower back pain. Recent compression fx of L spine. On vicodin but having issues with inadequate pain control, particularly when trying to sleep. Also getting itching when takes meds. Denies acute trauma. No acute numbness, tingling or loss of strength. No bladder/bowel incontinence or retention. Denies use of blood thinning medication.  CSN: [id number]  Arrival date & time 10/10/12  4098   First MD Initiated Contact with Patient 10/10/12 0315      Chief Complaint  Patient presents with  . Back Pain    (Consider location/radiation/quality/duration/timing/severity/associated sxs/prior treatment) HPI  Past Medical History  Diagnosis Date  . Diabetes   . Chronic kidney disease   . Hypertension     Past Surgical History  Procedure Date  . Tonsillectomy   . Hemrhoidectomy     No family history on file.  History  Substance Use Topics  . Smoking status: Never Smoker   . Smokeless tobacco: Not on file  . Alcohol Use: No    OB History    Grav Para Term Preterm Abortions TAB SAB Ect Mult Living                  Review of Systems   Review of symptoms negative unless otherwise noted in HPI.   Allergies  Actos; Avandia; Aspirin; Celecoxib; and Penicillins  Home Medications   Current Outpatient Rx  Name Route Sig Dispense Refill  . B COMPLEX-C PO TABS Oral Take 1 tablet by mouth daily.    Marland Kitchen CALCIUM CARBONATE 600 MG PO TABS Oral Take 600 mg by mouth daily after lunch.    Marland Kitchen VITAMIN D 1000 UNITS PO TABS Oral Take 1,000 Units by mouth daily after lunch.    . GLYBURIDE-METFORMIN 2.5-500 MG PO TABS Oral Take 2 tablets by mouth 2 (two) times daily with a meal.    . LOSARTAN POTASSIUM 100 MG PO TABS Oral Take 100 mg by mouth every morning.    Marland Kitchen PRAVASTATIN SODIUM 20 MG PO TABS Oral Take 20 mg by mouth at bedtime.    Marland Kitchen TIMOLOL MALEATE 0.5 % OP SOLG Both Eyes Place 1 drop into both eyes at bedtime.    . ACETAMINOPHEN 325 MG PO TABS Oral Take 2 tablets  (650 mg total) by mouth every 6 (six) hours as needed (or Fever >/= 101).    . ATENOLOL 50 MG PO TABS Oral Take 1 tablet (50 mg total) by mouth daily.    Marland Kitchen CIPROFLOXACIN HCL 500 MG PO TABS Oral Take 1 tablet (500 mg total) by mouth 2 (two) times daily. Discontinue after 10/20/2012.    Marland Kitchen DIAZEPAM 5 MG PO TABS Oral Take 0.5 tablets (2.5 mg total) by mouth every 8 (eight) hours as needed for anxiety. 3 tablet 0  . GLUCERNA SHAKE PO LIQD Oral Take 237 mLs by mouth 2 (two) times daily between meals.    Marland Kitchen HYDROCODONE-ACETAMINOPHEN 5-325 MG PO TABS Oral Take 0.5-1 tablets by mouth every 6 (six) hours as needed. 6 tablet 0  . ADULT MULTIVITAMIN W/MINERALS CH Oral Take 1 tablet by mouth daily.    Marland Kitchen POLYETHYLENE GLYCOL 3350 PO PACK Oral Take 17 g by mouth daily. 14 each   . SACCHAROMYCES BOULARDII 250 MG PO CAPS Oral Take 1 capsule (250 mg total) by mouth 2 (two) times daily. For 2 weeks, if no diarrhea then discontinue.    Bernadette Hoit SODIUM 8.6-50 MG PO TABS Oral Take 1 tablet by mouth at bedtime as needed.    Marland Kitchen  TAMSULOSIN HCL 0.4 MG PO CAPS Oral Take 1 capsule (0.4 mg total) by mouth daily. 30 capsule     BP 183/64  Pulse 70  Temp 98.1 F (36.7 C) (Oral)  Resp 22  SpO2 97%  Physical Exam  Nursing note and vitals reviewed. Constitutional: She appears well-developed and well-nourished. No distress.  HENT:  Head: Normocephalic and atraumatic.  Eyes: Conjunctivae normal are normal. Right eye exhibits no discharge. Left eye exhibits no discharge.  Neck: Neck supple.  Cardiovascular: Normal rate, regular rhythm and normal heart sounds.  Exam reveals no gallop and no friction rub.   No murmur heard. Pulmonary/Chest: Effort normal and breath sounds normal. No respiratory distress.  Abdominal: Soft. She exhibits no distension. There is no tenderness.  Musculoskeletal: She exhibits no edema and no tenderness.       Mild midline L spine tenderness. No step off or deformity. No overlying skin  changes.  Neurological: She is alert. She exhibits normal muscle tone.       Strength 5/5 b/l LE. Sensation intact to light touch.   Skin: Skin is warm and dry.  Psychiatric: She has a normal mood and affect. Her behavior is normal. Thought content normal.    ED Course  Procedures (including critical care time)  Labs Reviewed - No data to display No results found.   1. Back pain       MDM  87yF with back pain. Recent compression fx of L spine. Pain control issues. Will adjust meds. Social concerns about pt's ability to take care of self at home. Has home help starting this morning. Discussed with pt that may need to seriously consider rehab facility or SNF.        Raeford Razor, MD 10/17/12 1332

## 2014-06-16 ENCOUNTER — Other Ambulatory Visit: Payer: Self-pay | Admitting: Family Medicine

## 2014-06-16 ENCOUNTER — Ambulatory Visit
Admission: RE | Admit: 2014-06-16 | Discharge: 2014-06-16 | Disposition: A | Discharge status: Home / Self Care | Payer: Medicare Other | Source: Ambulatory Visit | Attending: Family Medicine | Admitting: Family Medicine

## 2014-06-16 DIAGNOSIS — R05 Cough: Secondary | ICD-10-CM

## 2014-06-16 DIAGNOSIS — R059 Cough, unspecified: Secondary | ICD-10-CM

## 2014-09-17 ENCOUNTER — Other Ambulatory Visit: Payer: Self-pay | Admitting: Family Medicine

## 2014-09-17 ENCOUNTER — Ambulatory Visit
Admission: RE | Admit: 2014-09-17 | Discharge: 2014-09-17 | Disposition: A | Discharge status: Home / Self Care | Payer: Medicare Other | Source: Ambulatory Visit | Attending: Family Medicine | Admitting: Family Medicine

## 2014-09-17 DIAGNOSIS — R0602 Shortness of breath: Secondary | ICD-10-CM

## 2014-09-18 ENCOUNTER — Inpatient Hospital Stay (HOSPITAL_BASED_OUTPATIENT_CLINIC_OR_DEPARTMENT_OTHER)
Admission: EM | Admit: 2014-09-18 | Discharge: 2014-09-30 | DRG: 291 | Disposition: E | Payer: Medicare Other | Attending: Critical Care Medicine | Admitting: Critical Care Medicine

## 2014-09-18 ENCOUNTER — Encounter (HOSPITAL_BASED_OUTPATIENT_CLINIC_OR_DEPARTMENT_OTHER): Payer: Self-pay | Admitting: Emergency Medicine

## 2014-09-18 DIAGNOSIS — I10 Essential (primary) hypertension: Secondary | ICD-10-CM | POA: Diagnosis present

## 2014-09-18 DIAGNOSIS — J9600 Acute respiratory failure, unspecified whether with hypoxia or hypercapnia: Secondary | ICD-10-CM | POA: Diagnosis present

## 2014-09-18 DIAGNOSIS — R000 Tachycardia, unspecified: Secondary | ICD-10-CM | POA: Diagnosis present

## 2014-09-18 DIAGNOSIS — Z87891 Personal history of nicotine dependence: Secondary | ICD-10-CM | POA: Diagnosis not present

## 2014-09-18 DIAGNOSIS — N390 Urinary tract infection, site not specified: Secondary | ICD-10-CM | POA: Diagnosis not present

## 2014-09-18 DIAGNOSIS — I447 Left bundle-branch block, unspecified: Secondary | ICD-10-CM | POA: Diagnosis present

## 2014-09-18 DIAGNOSIS — J9602 Acute respiratory failure with hypercapnia: Secondary | ICD-10-CM

## 2014-09-18 DIAGNOSIS — G8929 Other chronic pain: Secondary | ICD-10-CM | POA: Diagnosis present

## 2014-09-18 DIAGNOSIS — Z886 Allergy status to analgesic agent status: Secondary | ICD-10-CM

## 2014-09-18 DIAGNOSIS — Z515 Encounter for palliative care: Secondary | ICD-10-CM

## 2014-09-18 DIAGNOSIS — Z880 Allergy status to penicillin: Secondary | ICD-10-CM

## 2014-09-18 DIAGNOSIS — I959 Hypotension, unspecified: Secondary | ICD-10-CM | POA: Diagnosis not present

## 2014-09-18 DIAGNOSIS — Z79899 Other long term (current) drug therapy: Secondary | ICD-10-CM | POA: Diagnosis not present

## 2014-09-18 DIAGNOSIS — D638 Anemia in other chronic diseases classified elsewhere: Secondary | ICD-10-CM | POA: Diagnosis present

## 2014-09-18 DIAGNOSIS — Z8249 Family history of ischemic heart disease and other diseases of the circulatory system: Secondary | ICD-10-CM

## 2014-09-18 DIAGNOSIS — I129 Hypertensive chronic kidney disease with stage 1 through stage 4 chronic kidney disease, or unspecified chronic kidney disease: Secondary | ICD-10-CM | POA: Diagnosis present

## 2014-09-18 DIAGNOSIS — J9601 Acute respiratory failure with hypoxia: Secondary | ICD-10-CM | POA: Diagnosis present

## 2014-09-18 DIAGNOSIS — N179 Acute kidney failure, unspecified: Secondary | ICD-10-CM | POA: Diagnosis not present

## 2014-09-18 DIAGNOSIS — I509 Heart failure, unspecified: Secondary | ICD-10-CM

## 2014-09-18 DIAGNOSIS — I369 Nonrheumatic tricuspid valve disorder, unspecified: Secondary | ICD-10-CM

## 2014-09-18 DIAGNOSIS — E860 Dehydration: Secondary | ICD-10-CM

## 2014-09-18 DIAGNOSIS — R110 Nausea: Secondary | ICD-10-CM | POA: Diagnosis present

## 2014-09-18 DIAGNOSIS — T46905A Adverse effect of unspecified agents primarily affecting the cardiovascular system, initial encounter: Secondary | ICD-10-CM

## 2014-09-18 DIAGNOSIS — M549 Dorsalgia, unspecified: Secondary | ICD-10-CM | POA: Diagnosis present

## 2014-09-18 DIAGNOSIS — N183 Chronic kidney disease, stage 3 unspecified: Secondary | ICD-10-CM | POA: Diagnosis present

## 2014-09-18 DIAGNOSIS — E872 Acidosis, unspecified: Secondary | ICD-10-CM | POA: Diagnosis present

## 2014-09-18 DIAGNOSIS — Z66 Do not resuscitate: Secondary | ICD-10-CM | POA: Diagnosis not present

## 2014-09-18 DIAGNOSIS — I5031 Acute diastolic (congestive) heart failure: Secondary | ICD-10-CM | POA: Diagnosis present

## 2014-09-18 DIAGNOSIS — R32 Unspecified urinary incontinence: Secondary | ICD-10-CM | POA: Diagnosis present

## 2014-09-18 DIAGNOSIS — E119 Type 2 diabetes mellitus without complications: Secondary | ICD-10-CM | POA: Diagnosis present

## 2014-09-18 DIAGNOSIS — I4891 Unspecified atrial fibrillation: Secondary | ICD-10-CM | POA: Diagnosis present

## 2014-09-18 DIAGNOSIS — A498 Other bacterial infections of unspecified site: Secondary | ICD-10-CM | POA: Diagnosis not present

## 2014-09-18 DIAGNOSIS — E876 Hypokalemia: Secondary | ICD-10-CM | POA: Diagnosis not present

## 2014-09-18 DIAGNOSIS — R0602 Shortness of breath: Secondary | ICD-10-CM | POA: Diagnosis present

## 2014-09-18 DIAGNOSIS — E871 Hypo-osmolality and hyponatremia: Secondary | ICD-10-CM

## 2014-09-18 DIAGNOSIS — N189 Chronic kidney disease, unspecified: Secondary | ICD-10-CM | POA: Diagnosis present

## 2014-09-18 HISTORY — DX: Unspecified atrial fibrillation: I48.91

## 2014-09-18 LAB — CBC WITH DIFFERENTIAL/PLATELET
BASOS ABS: 0 10*3/uL (ref 0.0–0.1)
BASOS PCT: 0 % (ref 0–1)
Eosinophils Absolute: 0.1 10*3/uL (ref 0.0–0.7)
Eosinophils Relative: 1 % (ref 0–5)
HCT: 34.2 % — ABNORMAL LOW (ref 36.0–46.0)
Hemoglobin: 11.1 g/dL — ABNORMAL LOW (ref 12.0–15.0)
Lymphocytes Relative: 7 % — ABNORMAL LOW (ref 12–46)
Lymphs Abs: 0.9 10*3/uL (ref 0.7–4.0)
MCH: 29.8 pg (ref 26.0–34.0)
MCHC: 32.5 g/dL (ref 30.0–36.0)
MCV: 91.7 fL (ref 78.0–100.0)
Monocytes Absolute: 0.7 10*3/uL (ref 0.1–1.0)
Monocytes Relative: 5 % (ref 3–12)
NEUTROS ABS: 11.6 10*3/uL — AB (ref 1.7–7.7)
NEUTROS PCT: 87 % — AB (ref 43–77)
PLATELETS: 250 10*3/uL (ref 150–400)
RBC: 3.73 MIL/uL — ABNORMAL LOW (ref 3.87–5.11)
RDW: 14.6 % (ref 11.5–15.5)
WBC: 13.3 10*3/uL — ABNORMAL HIGH (ref 4.0–10.5)

## 2014-09-18 LAB — BASIC METABOLIC PANEL
Anion gap: 14 (ref 5–15)
BUN: 35 mg/dL — ABNORMAL HIGH (ref 6–23)
CHLORIDE: 95 meq/L — AB (ref 96–112)
CO2: 22 mEq/L (ref 19–32)
Calcium: 10.1 mg/dL (ref 8.4–10.5)
Creatinine, Ser: 0.9 mg/dL (ref 0.50–1.10)
GFR calc non Af Amer: 56 mL/min — ABNORMAL LOW (ref >90)
GFR, EST AFRICAN AMERICAN: 65 mL/min — AB (ref >90)
Glucose, Bld: 212 mg/dL — ABNORMAL HIGH (ref 70–99)
POTASSIUM: 5.3 meq/L (ref 3.7–5.3)
SODIUM: 131 meq/L — AB (ref 137–147)

## 2014-09-18 LAB — HEMOGLOBIN A1C
Hgb A1c MFr Bld: 6.3 % — ABNORMAL HIGH (ref <5.7)
MEAN PLASMA GLUCOSE: 134 mg/dL — AB (ref <117)

## 2014-09-18 LAB — MAGNESIUM: Magnesium: 1.3 mg/dL — ABNORMAL LOW (ref 1.5–2.5)

## 2014-09-18 LAB — TROPONIN I: Troponin I: <0.3 ng/mL (ref <0.30)

## 2014-09-18 LAB — HEPARIN LEVEL (UNFRACTIONATED)
Heparin Unfractionated: 0.39 IU/mL (ref 0.30–0.70)
Heparin Unfractionated: 0.6 IU/mL (ref 0.30–0.70)

## 2014-09-18 LAB — PRO B NATRIURETIC PEPTIDE: PRO B NATRI PEPTIDE: 6065 pg/mL — AB (ref 0–450)

## 2014-09-18 LAB — GLUCOSE, CAPILLARY
GLUCOSE-CAPILLARY: 162 mg/dL — AB (ref 70–99)
Glucose-Capillary: 231 mg/dL — ABNORMAL HIGH (ref 70–99)
Glucose-Capillary: 112 mg/dL — ABNORMAL HIGH (ref 70–99)
Glucose-Capillary: 120 mg/dL — ABNORMAL HIGH (ref 70–99)
Glucose-Capillary: 143 mg/dL — ABNORMAL HIGH (ref 70–99)

## 2014-09-18 LAB — TSH: TSH: 1.8 u[IU]/mL (ref 0.350–4.500)

## 2014-09-18 MED ORDER — METOPROLOL TARTRATE 50 MG PO TABS
50.0000 mg | ORAL_TABLET | Freq: Two times a day (BID) | ORAL | Status: DC
  Administered 2014-09-18 – 2014-09-20 (×3): 50 mg via ORAL
  Filled 2014-09-18 (×7): qty 1

## 2014-09-18 MED ORDER — CALCIUM CARBONATE 1250 (500 CA) MG PO TABS
1250.0000 mg | ORAL_TABLET | Freq: Every day | ORAL | Status: DC
  Administered 2014-09-18 – 2014-09-20 (×3): 1250 mg via ORAL
  Filled 2014-09-18 (×4): qty 1

## 2014-09-18 MED ORDER — LEVALBUTEROL HCL 0.63 MG/3ML IN NEBU: 0.6300 mg | INHALATION_SOLUTION | Freq: Four times a day (QID) | RESPIRATORY_TRACT | Status: DC | PRN

## 2014-09-18 MED ORDER — HEPARIN BOLUS VIA INFUSION
3000.0000 [IU] | Freq: Once | INTRAVENOUS | Status: AC
  Administered 2014-09-18: 3000 [IU] via INTRAVENOUS
  Filled 2014-09-18: qty 3000

## 2014-09-18 MED ORDER — ACETAMINOPHEN 650 MG RE SUPP: 650.0000 mg | Freq: Four times a day (QID) | RECTAL | Status: DC | PRN

## 2014-09-18 MED ORDER — ONDANSETRON HCL 4 MG PO TABS: 4.0000 mg | ORAL_TABLET | Freq: Four times a day (QID) | ORAL | Status: DC | PRN

## 2014-09-18 MED ORDER — DILTIAZEM HCL 25 MG/5ML IV SOLN
INTRAVENOUS | Status: AC
  Administered 2014-09-18: 20 mg
  Filled 2014-09-18: qty 20

## 2014-09-18 MED ORDER — METOPROLOL TARTRATE 25 MG PO TABS
25.0000 mg | ORAL_TABLET | Freq: Two times a day (BID) | ORAL | Status: DC
  Administered 2014-09-18: 25 mg via ORAL
  Filled 2014-09-18 (×2): qty 1

## 2014-09-18 MED ORDER — ADULT MULTIVITAMIN W/MINERALS CH
1.0000 | ORAL_TABLET | Freq: Every day | ORAL | Status: DC
  Administered 2014-09-18 – 2014-09-20 (×3): 1 via ORAL
  Filled 2014-09-18 (×4): qty 1

## 2014-09-18 MED ORDER — FUROSEMIDE 10 MG/ML IJ SOLN
40.0000 mg | Freq: Two times a day (BID) | INTRAMUSCULAR | Status: DC
  Administered 2014-09-18 – 2014-09-21 (×8): 40 mg via INTRAVENOUS
  Filled 2014-09-18 (×11): qty 4

## 2014-09-18 MED ORDER — SACCHAROMYCES BOULARDII 250 MG PO CAPS
250.0000 mg | ORAL_CAPSULE | Freq: Two times a day (BID) | ORAL | Status: DC
  Administered 2014-09-18 – 2014-09-20 (×6): 250 mg via ORAL
  Filled 2014-09-18 (×8): qty 1

## 2014-09-18 MED ORDER — LABETALOL HCL 5 MG/ML IV SOLN
5.0000 mg | INTRAVENOUS | Status: DC | PRN
  Administered 2014-09-18: 5 mg via INTRAVENOUS
  Filled 2014-09-18 (×2): qty 4

## 2014-09-18 MED ORDER — TAMSULOSIN HCL 0.4 MG PO CAPS
0.4000 mg | ORAL_CAPSULE | Freq: Every day | ORAL | Status: DC
  Administered 2014-09-19 – 2014-09-20 (×2): 0.4 mg via ORAL
  Filled 2014-09-18 (×4): qty 1

## 2014-09-18 MED ORDER — ONDANSETRON HCL 4 MG/2ML IJ SOLN
4.0000 mg | Freq: Four times a day (QID) | INTRAMUSCULAR | Status: DC | PRN
  Administered 2014-09-20: 4 mg via INTRAVENOUS
  Filled 2014-09-18: qty 2

## 2014-09-18 MED ORDER — CALCIUM GLUCONATE 10 % IV SOLN
1.0000 g | Freq: Once | INTRAVENOUS | Status: AC
  Administered 2014-09-18: 1 g via INTRAVENOUS
  Filled 2014-09-18: qty 10

## 2014-09-18 MED ORDER — SIMVASTATIN 10 MG PO TABS
10.0000 mg | ORAL_TABLET | Freq: Every day | ORAL | Status: DC
  Administered 2014-09-18 – 2014-09-21 (×4): 10 mg via ORAL
  Filled 2014-09-18 (×5): qty 1

## 2014-09-18 MED ORDER — ACETAMINOPHEN 325 MG PO TABS: 650.0000 mg | ORAL_TABLET | Freq: Four times a day (QID) | ORAL | Status: DC | PRN

## 2014-09-18 MED ORDER — LOSARTAN POTASSIUM 50 MG PO TABS
50.0000 mg | ORAL_TABLET | Freq: Every morning | ORAL | Status: DC
  Filled 2014-09-18: qty 1

## 2014-09-18 MED ORDER — DILTIAZEM HCL 100 MG IV SOLR
5.0000 mg/h | INTRAVENOUS | Status: DC
  Administered 2014-09-18: 5 mg/h via INTRAVENOUS

## 2014-09-18 MED ORDER — GUAIFENESIN-DM 100-10 MG/5ML PO SYRP
5.0000 mL | ORAL_SOLUTION | ORAL | Status: DC | PRN
  Administered 2014-09-18: 5 mL via ORAL
  Filled 2014-09-18: qty 5

## 2014-09-18 MED ORDER — HEPARIN (PORCINE) IN NACL 100-0.45 UNIT/ML-% IJ SOLN
1000.0000 [IU]/h | INTRAMUSCULAR | Status: DC
  Administered 2014-09-18 – 2014-09-20 (×3): 1000 [IU]/h via INTRAVENOUS
  Filled 2014-09-18 (×4): qty 250

## 2014-09-18 MED ORDER — HYDROCODONE-ACETAMINOPHEN 5-325 MG PO TABS: 0.5000 | ORAL_TABLET | Freq: Four times a day (QID) | ORAL | Status: DC | PRN

## 2014-09-18 MED ORDER — POLYETHYLENE GLYCOL 3350 17 G PO PACK
17.0000 g | PACK | Freq: Every day | ORAL | Status: DC
  Administered 2014-09-19 – 2014-09-20 (×2): 17 g via ORAL
  Filled 2014-09-18 (×4): qty 1

## 2014-09-18 MED ORDER — SENNOSIDES-DOCUSATE SODIUM 8.6-50 MG PO TABS
1.0000 | ORAL_TABLET | Freq: Every evening | ORAL | Status: DC | PRN
  Filled 2014-09-18: qty 1

## 2014-09-18 MED ORDER — SODIUM CHLORIDE 0.9 % IJ SOLN
3.0000 mL | Freq: Two times a day (BID) | INTRAMUSCULAR | Status: DC
  Administered 2014-09-18 – 2014-09-24 (×11): 3 mL via INTRAVENOUS

## 2014-09-18 MED ORDER — VITAMIN D3 25 MCG (1000 UNIT) PO TABS
1000.0000 [IU] | ORAL_TABLET | Freq: Every day | ORAL | Status: DC
  Administered 2014-09-18 – 2014-09-23 (×6): 1000 [IU] via ORAL
  Filled 2014-09-18 (×7): qty 1

## 2014-09-18 MED ORDER — FUROSEMIDE 10 MG/ML IJ SOLN
40.0000 mg | Freq: Once | INTRAMUSCULAR | Status: AC
  Administered 2014-09-18: 40 mg via INTRAVENOUS
  Filled 2014-09-18: qty 4

## 2014-09-18 MED ORDER — GLUCERNA SHAKE PO LIQD
237.0000 mL | Freq: Two times a day (BID) | ORAL | Status: DC
  Administered 2014-09-18 – 2014-09-20 (×3): 237 mL via ORAL

## 2014-09-18 MED ORDER — INSULIN ASPART 100 UNIT/ML ~~LOC~~ SOLN
0.0000 [IU] | Freq: Three times a day (TID) | SUBCUTANEOUS | Status: DC
  Administered 2014-09-18: 5 [IU] via SUBCUTANEOUS
  Administered 2014-09-18 – 2014-09-19 (×2): 3 [IU] via SUBCUTANEOUS
  Administered 2014-09-20 (×3): 5 [IU] via SUBCUTANEOUS
  Administered 2014-09-21: 8 [IU] via SUBCUTANEOUS

## 2014-09-18 MED ORDER — INSULIN ASPART 100 UNIT/ML ~~LOC~~ SOLN: 0.0000 [IU] | Freq: Every day | SUBCUTANEOUS | Status: DC

## 2014-09-18 MED ORDER — DIAZEPAM 5 MG PO TABS: 2.5000 mg | ORAL_TABLET | Freq: Three times a day (TID) | ORAL | Status: DC | PRN

## 2014-09-18 MED ORDER — TIMOLOL MALEATE 0.5 % OP SOLN
1.0000 [drp] | Freq: Every day | OPHTHALMIC | Status: DC
  Administered 2014-09-18 – 2014-09-23 (×6): 1 [drp] via OPHTHALMIC
  Filled 2014-09-18: qty 5

## 2014-09-18 MED ORDER — METOPROLOL TARTRATE 50 MG PO TABS
50.0000 mg | ORAL_TABLET | Freq: Two times a day (BID) | ORAL | Status: DC
  Filled 2014-09-18: qty 1

## 2014-09-18 MED ORDER — DILTIAZEM LOAD VIA INFUSION
20.0000 mg | Freq: Once | INTRAVENOUS | Status: AC
  Filled 2014-09-18: qty 20

## 2014-09-18 MED ORDER — MAGNESIUM SULFATE 40 MG/ML IJ SOLN
2.0000 g | Freq: Once | INTRAMUSCULAR | Status: AC
  Administered 2014-09-18: 2 g via INTRAVENOUS
  Filled 2014-09-18: qty 50

## 2014-09-18 MED ORDER — LOSARTAN POTASSIUM 25 MG PO TABS
25.0000 mg | ORAL_TABLET | Freq: Every morning | ORAL | Status: DC
  Filled 2014-09-18: qty 1

## 2014-09-18 MED ORDER — MOMETASONE FURO-FORMOTEROL FUM 100-5 MCG/ACT IN AERO
2.0000 | INHALATION_SPRAY | Freq: Two times a day (BID) | RESPIRATORY_TRACT | Status: DC
  Administered 2014-09-18 – 2014-09-21 (×7): 2 via RESPIRATORY_TRACT
  Filled 2014-09-18: qty 8.8

## 2014-09-18 MED ORDER — SODIUM CHLORIDE 0.9 % IV SOLN
1.0000 g | Freq: Once | INTRAVENOUS | Status: AC
  Administered 2014-09-18: 1 g via INTRAVENOUS
  Filled 2014-09-18: qty 10

## 2014-09-18 MED ORDER — B COMPLEX-C PO TABS
1.0000 | ORAL_TABLET | Freq: Every day | ORAL | Status: DC
  Administered 2014-09-18 – 2014-09-20 (×3): 1 via ORAL
  Filled 2014-09-18 (×4): qty 1

## 2014-09-18 NOTE — Progress Notes (Signed)
ANTICOAGULATION CONSULT NOTE - Initial Consult  Pharmacy Consult for Heparin Indication: atrial fibrillation  Allergies  Allergen Reactions  . Actos [Pioglitazone] Anaphylaxis  . Avandia [Rosiglitazone] Shortness Of Breath  . Aspirin Other (See Comments)    Reaction: cramping and GI upset  . Celecoxib Other (See Comments)    Reaction: GI upset  . Penicillins Hives    Patient Measurements: Height: 5' 2.5" (158.8 cm) Weight: 171 lb 11.8 oz (77.9 kg) IBW/kg (Calculated) : 51.25 Heparin Dosing Weight: 70 kg  Vital Signs: Temp: 97.8 F (36.6 C) (09/19 1417) Temp src: Oral (09/19 1417) BP: 98/59 mmHg (09/19 1417) Pulse Rate: 129 (09/19 1417)  Labs:  Recent Labs  08/31/2014 0116 09/23/2014 0905 09/08/2014 1503  HGB 11.1*  --   --   HCT 34.2*  --   --   PLT 250  --   --   HEPARINUNFRC  --   --  0.39  CREATININE 0.90  --   --   TROPONINI <0.30 <0.30  --     Estimated Creatinine Clearance: 43 ml/min (by C-G formula based on Cr of 0.9).   Medical History: Past Medical History  Diagnosis Date  . Diabetes   . Chronic kidney disease   . Hypertension   . A-fib     a. Dx 08/2014, CHA2DS2VASc = 5;  b. 08/2014 Echo: EF 50-55%, mod MR, mod dil LA, mod-sev TR, PASP ..    Medications:  Prescriptions prior to admission  Medication Sig Dispense Refill  . Fluticasone-Salmeterol (ADVAIR) 100-50 MCG/DOSE AEPB Inhale 1 puff into the lungs 2 (two) times daily.      . furosemide (LASIX) 20 MG tablet Take 20 mg by mouth.      . levofloxacin (LEVAQUIN) 750 MG tablet Take 750 mg by mouth daily.      Marland Kitchen acetaminophen (TYLENOL) 325 MG tablet Take 2 tablets (650 mg total) by mouth every 6 (six) hours as needed (or Fever >/= 101).      Marland Kitchen atenolol (TENORMIN) 50 MG tablet Take 1 tablet (50 mg total) by mouth daily.      . B Complex-C (B-COMPLEX WITH VITAMIN C) tablet Take 1 tablet by mouth daily.      . calcium carbonate (OS-CAL) 600 MG TABS Take 600 mg by mouth daily after lunch.      .  cholecalciferol (VITAMIN D) 1000 UNITS tablet Take 1,000 Units by mouth daily after lunch.      . diazepam (VALIUM) 5 MG tablet Take 0.5 tablets (2.5 mg total) by mouth every 8 (eight) hours as needed for anxiety.  3 tablet  0  . feeding supplement (GLUCERNA SHAKE) LIQD Take 237 mLs by mouth 2 (two) times daily between meals.      Marland Kitchen glyBURIDE-metformin (GLUCOVANCE) 2.5-500 MG per tablet Take 2 tablets by mouth 2 (two) times daily with a meal.      . HYDROcodone-acetaminophen (NORCO/VICODIN) 5-325 MG per tablet Take 0.5-1 tablets by mouth every 6 (six) hours as needed.  6 tablet  0  . losartan (COZAAR) 100 MG tablet Take 100 mg by mouth every morning.      . Multiple Vitamin (MULTIVITAMIN WITH MINERALS) TABS Take 1 tablet by mouth daily.      . polyethylene glycol (MIRALAX / GLYCOLAX) packet Take 17 g by mouth daily.  14 each    . pravastatin (PRAVACHOL) 20 MG tablet Take 20 mg by mouth at bedtime.      . saccharomyces boulardii (FLORASTOR) 250 MG capsule  Take 1 capsule (250 mg total) by mouth 2 (two) times daily. For 2 weeks, if no diarrhea then discontinue.      . senna-docusate (SENOKOT-S) 8.6-50 MG per tablet Take 1 tablet by mouth at bedtime as needed.      . Tamsulosin HCl (FLOMAX) 0.4 MG CAPS Take 1 capsule (0.4 mg total) by mouth daily.  30 capsule    . timolol (TIMOPTIC-XR) 0.5 % ophthalmic gel-forming Place 1 drop into both eyes at bedtime.        Assessment: 80 yo female on IV heparin for new afib, Heparin level 0.39 on 1000 units/hr. Likely will switch to Xarelto vs. Eliquis at discharge. Hgb 11.1, plt wnl.  Goal of Therapy:  Heparin level 0.3-0.7 units/ml Monitor platelets by anticoagulation protocol: Yes   Plan:  Continue heparin 1000 units/hr F/u AM heparin level and CBC F/u plans for oral anticoagulation.  Bayard Hugger, PharmD, BCPS  Clinical Pharmacist  Pager: [phone]   09/23/2014,3:54 PM

## 2014-09-18 NOTE — Progress Notes (Signed)
Dr. Mayford Knife notified as patient's heart rate maintaining in A-fib 110s-140s despite metoprolol dose (see MAR).  Patient short of breath at rest, consistent throughout shift.  Per Dr. Mayford Knife, notify again in an hour if HR still maintaining in 110s-140s.  Night RN made aware, will continue to monitor.

## 2014-09-18 NOTE — Evaluation (Signed)
Physical Therapy Evaluation Patient Details Name: Natalie Hicks MRN: [id number] DOB: [birth year] Today's Date: 09/17/2014   History of Present Illness  Natalie Hicks is a 80 y.o. Caucasian female with history of hypertension, chronic kidney disease stage III, diabetes, urinary incontinence, and chronic back pain who presents with the above complaints.  Patient reports that her symptoms started about 2 days ago when she noted that she was gradually becoming more short of breath.  She did report that she had cough which was productive for mucus.  Due to her ongoing shortness of breath, she presented to the emergency department after having dinner last night.  She denies any recent fevers, chills, vomiting, chest pain, abdominal pain, diarrhea, headaches or vision changes.  She did admit to being nauseated but has not vomited.  When patient presented to [facility] she was found to be in atrial fibrillation with rapid ventricular rate  Clinical Impression  Pt admitted with generalized weakness, fatigue, and SOB. Pt currently with functional limitations due to the deficits listed below (see PT Problem List). Mobility limited to transfers today at request of physician, pt at min A level. Pt will benefit from skilled PT to increase their independence and safety with mobility to allow discharge to the venue listed below. PT will continue to follow.       Follow Up Recommendations Home health PT;Supervision - Intermittent    Equipment Recommendations  None recommended by PT    Recommendations for Other Services       Precautions / Restrictions Precautions Precautions: Fall Precaution Comments: pt denies falls at ALF but is very weak now with SOB and incr HR Restrictions Weight Bearing Restrictions: No      Mobility  Bed Mobility Overal bed mobility: Needs Assistance Bed Mobility: Supine to Sit     Supine to sit: Min assist     General bed mobility comments: min A to get  fully onto side to initiate sliding legs off bed. All mobility causing increased SOB and fatigue for pt  Transfers Overall transfer level: Needs assistance Equipment used: Rolling walker (2 wheeled) Transfers: Sit to/from [employer] Sit to Stand: Min assist Stand pivot transfers: Min assist       General transfer comment: min A for power and stability  Ambulation/Gait             General Gait Details: no ambulation today per physicial request until HR better controlled  Stairs            Wheelchair Mobility    Modified Rankin (Stroke Patients Only)       Balance Overall balance assessment: Needs assistance Sitting-balance support: No upper extremity supported;Feet supported Sitting balance-Leahy Scale: Fair     Standing balance support: Bilateral upper extremity supported;During functional activity Standing balance-Leahy Scale: Poor Standing balance comment: needs UE support                             Pertinent Vitals/Pain Pain Assessment: No/denies pain HR up to 145 bpm with transfer    Home Living Family/patient expects to be discharged to:: Assisted living               Home Equipment: Walker - 2 wheels;Shower seat;Bedside commode Additional Comments: [employer]    Prior Function Level of Independence: Independent with assistive device(s)         Comments: PTA, pt was ambulating multiple times around  building with RW and going to chair exercise classes, though reports that she had been getting more SOB in recent months     Hand Dominance        Extremity/Trunk Assessment   Upper Extremity Assessment: Generalized weakness           Lower Extremity Assessment: Generalized weakness      Cervical / Trunk Assessment: Kyphotic  Communication   Communication: No difficulties  Cognition Arousal/Alertness: Awake/alert Behavior During Therapy: WFL for tasks assessed/performed Overall  Cognitive Status: Within Functional Limits for tasks assessed                      General Comments      Exercises General Exercises - Lower Extremity Ankle Circles/Pumps: AROM;Both;10 reps;Seated      Assessment/Plan    PT Assessment Patient needs continued PT services  PT Diagnosis Difficulty walking;Generalized weakness   PT Problem List Decreased strength;Decreased activity tolerance;Decreased balance;Decreased mobility;Cardiopulmonary status limiting activity  PT Treatment Interventions DME instruction;Gait training;Functional mobility training;Therapeutic activities;Therapeutic exercise;Balance training;Patient/family education   PT Goals (Current goals can be found in the Care Plan section) Acute Rehab PT Goals Patient Stated Goal: return to ALF PT Goal Formulation: With patient Time For Goal Achievement: 10/02/14 Potential to Achieve Goals: Good    Frequency Min 3X/week   Barriers to discharge Decreased caregiver support ALF    Co-evaluation               End of Session Equipment Utilized During Treatment: Oxygen Activity Tolerance: Patient limited by fatigue Patient left: in chair;with call bell/phone within reach Nurse Communication: Mobility status         Time: 1500-1530 PT Time Calculation (min): 30 min   Charges:   PT Evaluation $Initial PT Evaluation Tier I: 1 Procedure PT Treatments $Therapeutic Activity: 23-37 mins   PT G Codes:         Lyanne Co, PT  Acute Rehab Services  [phone]  Lyanne Co 09/22/2014, 3:56 PM

## 2014-09-18 NOTE — Progress Notes (Signed)
ANTICOAGULATION CONSULT NOTE - Follow Up Consult  Pharmacy Consult for Heparin  Indication: atrial fibrillation  Allergies  Allergen Reactions  . Actos [Pioglitazone] Anaphylaxis  . Avandia [Rosiglitazone] Shortness Of Breath  . Aspirin Other (See Comments)    Reaction: cramping and GI upset  . Celecoxib Other (See Comments)    Reaction: GI upset  . Penicillins Hives    Patient Measurements: Height: 5' 2.5" (158.8 cm) Weight: 171 lb 11.8 oz (77.9 kg) IBW/kg (Calculated) : 51.25 Vital Signs: Temp: 97.9 F (36.6 C) (09/19 2024) Temp src: Oral (09/19 2024) BP: 98/47 mmHg (09/19 2024) Pulse Rate: 104 (09/19 2024)  Labs:  Recent Labs  09/10/2014 0116 09/08/2014 0905 09/29/2014 1503 09/18/2014 2245  HGB 11.1*  --   --   --   HCT 34.2*  --   --   --   PLT 250  --   --   --   HEPARINUNFRC  --   --  0.39 0.60  CREATININE 0.90  --   --   --   TROPONINI <0.30 <0.30 <0.30  --     Estimated Creatinine Clearance: 43 ml/min (by C-G formula based on Cr of 0.9).   Assessment: Therapeutic heparin level x 2, other labs as above.   Goal of Ther3apy:  Heparin level 0.3-0.7 units/ml Monitor platelets by anticoagulation protocol: Yes   Plan:  -Continue at 1000 units/hr -Daily CBC/HL -Monitor for bleeding  Abran Duke 09/10/2014,11:26 PM

## 2014-09-18 NOTE — Progress Notes (Signed)
  Echocardiogram 2D Echocardiogram has been performed.  Keahi Mccarney FRANCES 10-16-14, 10:33 AM

## 2014-09-18 NOTE — ED Provider Notes (Addendum)
CSN: [id number]     Arrival date & time 09/19/2014  0101 History   First MD Initiated Contact with Patient 09/07/2014 0119     Chief Complaint  Patient presents with  . Shortness of Breath     (Consider location/radiation/quality/duration/timing/severity/associated sxs/prior Treatment) HPI This is an 80 year old female with no history of atrial fibrillation. She is here with a two-day history of shortness of breath. The shortness of breath is moderate to severe. It is worse with activity or lying flat. It is severe enough that she was unable to sleep this morning. A chest x-ray was obtained yesterday and she was prescribed Lasix and Advair which have not been started. She denies chest pain. She denies palpitations. She denies fever. Shortness of breath was not improved with supplemental oxygen.  Past Medical History  Diagnosis Date  . Diabetes   . Chronic kidney disease   . Hypertension    Past Surgical History  Procedure Laterality Date  . Tonsillectomy    . Hemrhoidectomy     No family history on file. History  Substance Use Topics  . Smoking status: Never Smoker   . Smokeless tobacco: Not on file  . Alcohol Use: No   OB History   Grav Para Term Preterm Abortions TAB SAB Ect Mult Living                 Review of Systems  All other systems reviewed and are negative.  Allergies  Actos; Avandia; Aspirin; Celecoxib; and Penicillins  Home Medications   Prior to Admission medications   Medication Sig Start Date End Date Taking? Authorizing Provider  Fluticasone-Salmeterol (ADVAIR) 100-50 MCG/DOSE AEPB Inhale 1 puff into the lungs 2 (two) times daily.   Yes Historical Provider, MD  furosemide (LASIX) 20 MG tablet Take 20 mg by mouth.   Yes Historical Provider, MD  levofloxacin (LEVAQUIN) 750 MG tablet Take 750 mg by mouth daily.   Yes Historical Provider, MD  acetaminophen (TYLENOL) 325 MG tablet Take 2 tablets (650 mg total) by mouth every 6 (six) hours as needed (or Fever  >/= 101). 10/16/12   Cristal Ford, MD  atenolol (TENORMIN) 50 MG tablet Take 1 tablet (50 mg total) by mouth daily. 10/16/12   Srikar Cherlynn Kaiser, MD  B Complex-C (B-COMPLEX WITH VITAMIN C) tablet Take 1 tablet by mouth daily.    Historical Provider, MD  calcium carbonate (OS-CAL) 600 MG TABS Take 600 mg by mouth daily after lunch.    Historical Provider, MD  cholecalciferol (VITAMIN D) 1000 UNITS tablet Take 1,000 Units by mouth daily after lunch.    Historical Provider, MD  diazepam (VALIUM) 5 MG tablet Take 0.5 tablets (2.5 mg total) by mouth every 8 (eight) hours as needed for anxiety. 10/16/12   Cristal Ford, MD  feeding supplement (GLUCERNA SHAKE) LIQD Take 237 mLs by mouth 2 (two) times daily between meals. 10/16/12   Srikar Cherlynn Kaiser, MD  glyBURIDE-metformin (GLUCOVANCE) 2.5-500 MG per tablet Take 2 tablets by mouth 2 (two) times daily with a meal.    Historical Provider, MD  HYDROcodone-acetaminophen (NORCO/VICODIN) 5-325 MG per tablet Take 0.5-1 tablets by mouth every 6 (six) hours as needed. 10/16/12   Cristal Ford, MD  losartan (COZAAR) 100 MG tablet Take 100 mg by mouth every morning.    Historical Provider, MD  Multiple Vitamin (MULTIVITAMIN WITH MINERALS) TABS Take 1 tablet by mouth daily.    Historical Provider, MD  polyethylene glycol (MIRALAX / GLYCOLAX) packet Take  17 g by mouth daily. 10/16/12   Srikar Cherlynn Kaiser, MD  pravastatin (PRAVACHOL) 20 MG tablet Take 20 mg by mouth at bedtime.    Historical Provider, MD  saccharomyces boulardii (FLORASTOR) 250 MG capsule Take 1 capsule (250 mg total) by mouth 2 (two) times daily. For 2 weeks, if no diarrhea then discontinue. 10/16/12   Srikar Cherlynn Kaiser, MD  senna-docusate (SENOKOT-S) 8.6-50 MG per tablet Take 1 tablet by mouth at bedtime as needed. 10/16/12   Srikar Cherlynn Kaiser, MD  Tamsulosin HCl (FLOMAX) 0.4 MG CAPS Take 1 capsule (0.4 mg total) by mouth daily. 10/16/12   Srikar Cherlynn Kaiser, MD  timolol (TIMOPTIC-XR) 0.5 % ophthalmic gel-forming  Place 1 drop into both eyes at bedtime.    Historical Provider, MD   BP 119/82  Pulse 98  Temp(Src) 98 F (36.7 C) (Oral)  Resp 22  SpO2 100%  Physical Exam General: Well-developed, well-nourished female in no acute distress; appearance consistent with age of record HENT: normocephalic; atraumatic Eyes: pupils equal, round and reactive to light; extraocular muscles intact; lens implants Neck: supple Heart: Irregular rhythm; tachycardia Lungs: A few basilar rales; mild tachypnea Abdomen: soft; nondistended; nontender; no masses or hepatosplenomegaly; bowel sounds present Back: Kyphosis Extremities: Arthritic changes; pulses normal; no edema Neurologic: Awake, alert and oriented; motor function intact in all extremities and symmetric; no facial droop Skin: Warm and dry Psychiatric: Normal mood and affect    ED Course  Procedures (including critical care time)  CRITICAL CARE Performed by: Aprill Banko L Total critical care time: 45 minutes Critical care time was exclusive of separately billable procedures and treating other patients. Critical care was necessary to treat or prevent imminent or life-threatening deterioration. Critical care was time spent personally by me on the following activities: development of treatment plan with patient and/or surrogate as well as nursing, discussions with consultants, evaluation of patient's response to treatment, examination of patient, obtaining history from patient or surrogate, ordering and performing treatments and interventions, ordering and review of laboratory studies, ordering and review of radiographic studies, pulse oximetry and re-evaluation of patient's condition.   MDM  Nursing notes and vitals signs, including pulse oximetry, reviewed.  Summary of this visit's results, reviewed by myself:   EKG Interpretation  Date/Time:  Saturday September 18 2014 01:31:11 EDT Ventricular Rate:  104 PR Interval:    QRS Duration: 142 QT  Interval:  374 QTC Calculation: 491 R Axis:   3 Text Interpretation:  Atrial fibrillation with rapid ventricular response Left bundle branch block Abnormal ECG No significant change was found Confirmed by Trestan Vahle  MD, Jonny Ruiz ([id number]) on 09/01/2014 1:34:44 AM      Labs:  Results for orders placed during the hospital encounter of 09/11/2014 (from the past 24 hour(s))  CBC WITH DIFFERENTIAL     Status: Abnormal   Collection Time    09/22/2014  1:16 AM      Result Value Ref Range   WBC 13.3 (*) 4.0 - 10.5 K/uL   RBC 3.73 (*) 3.87 - 5.11 MIL/uL   Hemoglobin 11.1 (*) 12.0 - 15.0 g/dL   HCT 60.4 (*) 54.0 - 98.1 %   MCV 91.7  78.0 - 100.0 fL   MCH 29.8  26.0 - 34.0 pg   MCHC 32.5  30.0 - 36.0 g/dL   RDW 19.1  47.8 - 29.5 %   Platelets 250  150 - 400 K/uL   Neutrophils Relative % 87 (*) 43 - 77 %   Neutro Abs 11.6 (*)  1.7 - 7.7 K/uL   Lymphocytes Relative 7 (*) 12 - 46 %   Lymphs Abs 0.9  0.7 - 4.0 K/uL   Monocytes Relative 5  3 - 12 %   Monocytes Absolute 0.7  0.1 - 1.0 K/uL   Eosinophils Relative 1  0 - 5 %   Eosinophils Absolute 0.1  0.0 - 0.7 K/uL   Basophils Relative 0  0 - 1 %   Basophils Absolute 0.0  0.0 - 0.1 K/uL  BASIC METABOLIC PANEL     Status: Abnormal   Collection Time    08/31/2014  1:16 AM      Result Value Ref Range   Sodium 131 (*) 137 - 147 mEq/L   Potassium 5.3  3.7 - 5.3 mEq/L   Chloride 95 (*) 96 - 112 mEq/L   CO2 22  19 - 32 mEq/L   Glucose, Bld 212 (*) 70 - 99 mg/dL   BUN 35 (*) 6 - 23 mg/dL   Creatinine, Ser 1.61  0.50 - 1.10 mg/dL   Calcium 09.6  8.4 - 04.5 mg/dL   GFR calc non Af Amer 56 (*) >90 mL/min   GFR calc Af Amer 65 (*) >90 mL/min   Anion gap 14  5 - 15  TROPONIN I     Status: None   Collection Time    09/06/2014  1:16 AM      Result Value Ref Range   Troponin I <0.30  <0.30 ng/mL  PRO B NATRIURETIC PEPTIDE     Status: Abnormal   Collection Time    09/20/2014  1:16 AM      Result Value Ref Range   Pro B Natriuretic peptide (BNP) 6065.0 (*) 0 - 450  pg/mL    Imaging Studies: Dg Chest 2 View  09/17/2014   CLINICAL DATA:  Cough, shortness of Breath  EXAM: CHEST  2 VIEW  COMPARISON:  06/16/2014  FINDINGS: Cardiomegaly is noted. No acute infiltrate or pulmonary edema. Stable chronic interstitial prominence. Osteopenia and degenerative changes thoracic spine. Prior vertebroplasty lumbar spine.  IMPRESSION: No infiltrate or pulmonary edema. Cardiomegaly. Chronic interstitial prominence.   Electronically Signed   By: Natasha Mead M.D.   On: 09/17/2014 16:25   1:39 AM Cardizem bolus and drip started for AFIB with RVR.  2:07 AM Patient became bradycardic and hypotensive after Cardizem bolus. Cardizem drip discontinued. Calcium gluconate 1 g infusion initiated. Patient also given 40 mg of Lasix for diuresis in light of elevated BNP.  3:40 AM HR 60's - 70's, BP 113/57, RR 17 after second 1g of calcium gluconate. Patient's breathing has improved to the point that she can sleep. Small amount of diuresis.  Hanley Seamen, MD Sep 26, 2014 0341  Hanley Seamen, MD 09/01/2014 [phone]

## 2014-09-18 NOTE — Progress Notes (Signed)
ANTICOAGULATION CONSULT NOTE - Initial Consult  Pharmacy Consult for Heparin Indication: atrial fibrillation  Allergies  Allergen Reactions  . Actos [Pioglitazone] Anaphylaxis  . Avandia [Rosiglitazone] Shortness Of Breath  . Aspirin Other (See Comments)    Reaction: cramping and GI upset  . Celecoxib Other (See Comments)    Reaction: GI upset  . Penicillins Hives    Patient Measurements: Height: 5' 2.5" (158.8 cm) Weight: 171 lb 11.8 oz (77.9 kg) IBW/kg (Calculated) : 51.25 Heparin Dosing Weight: 70 kg  Vital Signs: Temp: 97.4 F (36.3 C) (09/19 0548) Temp src: Oral (09/19 0548) BP: 133/97 mmHg (09/19 0548) Pulse Rate: 80 (09/19 0428)  Labs:  Recent Labs  09/23/2014 0116  HGB 11.1*  HCT 34.2*  PLT 250  CREATININE 0.90  TROPONINI <0.30    Estimated Creatinine Clearance: 43 ml/min (by C-G formula based on Cr of 0.9).   Medical History: Past Medical History  Diagnosis Date  . Diabetes   . Chronic kidney disease   . Hypertension     Medications:  Prescriptions prior to admission  Medication Sig Dispense Refill  . Fluticasone-Salmeterol (ADVAIR) 100-50 MCG/DOSE AEPB Inhale 1 puff into the lungs 2 (two) times daily.      . furosemide (LASIX) 20 MG tablet Take 20 mg by mouth.      . levofloxacin (LEVAQUIN) 750 MG tablet Take 750 mg by mouth daily.      Marland Kitchen acetaminophen (TYLENOL) 325 MG tablet Take 2 tablets (650 mg total) by mouth every 6 (six) hours as needed (or Fever >/= 101).      Marland Kitchen atenolol (TENORMIN) 50 MG tablet Take 1 tablet (50 mg total) by mouth daily.      . B Complex-C (B-COMPLEX WITH VITAMIN C) tablet Take 1 tablet by mouth daily.      . calcium carbonate (OS-CAL) 600 MG TABS Take 600 mg by mouth daily after lunch.      . cholecalciferol (VITAMIN D) 1000 UNITS tablet Take 1,000 Units by mouth daily after lunch.      . diazepam (VALIUM) 5 MG tablet Take 0.5 tablets (2.5 mg total) by mouth every 8 (eight) hours as needed for anxiety.  3 tablet  0  .  feeding supplement (GLUCERNA SHAKE) LIQD Take 237 mLs by mouth 2 (two) times daily between meals.      Marland Kitchen glyBURIDE-metformin (GLUCOVANCE) 2.5-500 MG per tablet Take 2 tablets by mouth 2 (two) times daily with a meal.      . HYDROcodone-acetaminophen (NORCO/VICODIN) 5-325 MG per tablet Take 0.5-1 tablets by mouth every 6 (six) hours as needed.  6 tablet  0  . losartan (COZAAR) 100 MG tablet Take 100 mg by mouth every morning.      . Multiple Vitamin (MULTIVITAMIN WITH MINERALS) TABS Take 1 tablet by mouth daily.      . polyethylene glycol (MIRALAX / GLYCOLAX) packet Take 17 g by mouth daily.  14 each    . pravastatin (PRAVACHOL) 20 MG tablet Take 20 mg by mouth at bedtime.      . saccharomyces boulardii (FLORASTOR) 250 MG capsule Take 1 capsule (250 mg total) by mouth 2 (two) times daily. For 2 weeks, if no diarrhea then discontinue.      . senna-docusate (SENOKOT-S) 8.6-50 MG per tablet Take 1 tablet by mouth at bedtime as needed.      . Tamsulosin HCl (FLOMAX) 0.4 MG CAPS Take 1 capsule (0.4 mg total) by mouth daily.  30 capsule    .  timolol (TIMOPTIC-XR) 0.5 % ophthalmic gel-forming Place 1 drop into both eyes at bedtime.        Assessment: 80 yo female with SOB/Afib for heparin Goal of Therapy:  Heparin level 0.3-0.7 units/ml Monitor platelets by anticoagulation protocol: Yes   Plan:  Heparin 3000 units IV bolus, then 1000 units/hr Check heparin level in 8 hours.   Eddie Candle 09/10/2014,6:34 AM

## 2014-09-18 NOTE — Consult Note (Signed)
CARDIOLOGY CONSULT NOTE   Patient ID: Natalie Hicks MRN: [id number], DOB/AGE: 78-09-1925   Admit date: 09/15/2014 Date of Consult: 09/09/2014  Primary Physician: Lupita Raider, MD Primary Cardiologist: new to  - seen by Katherina Right, MD  Pt. Profile  80 y/o female without prior cardiac hx, who presented to the ED yesterday with afib RVR.  Problem List  Past Medical History  Diagnosis Date  . Diabetes   . Chronic kidney disease   . Hypertension     Past Surgical History  Procedure Laterality Date  . Tonsillectomy    . Hemrhoidectomy      Allergies  Allergies  Allergen Reactions  . Actos [Pioglitazone] Anaphylaxis  . Avandia [Rosiglitazone] Shortness Of Breath  . Aspirin Other (See Comments)    Reaction: cramping and GI upset  . Celecoxib Other (See Comments)    Reaction: GI upset  . Penicillins Hives   HPI   80 y/o female with the above problem list.  She does not have a prior cardiac hx.  She also has a h/o chronic cough and was treated w/ outpt abx in June.  At baseline, she lives by herself in assisted living and generally uses a walker to get around.  She has no h/o falls and tries to walk around her building 4x/day.  She was in her USOH until approx 2 days prior to admission when she began to note increasing dyspnea. Early this morning, she was transferred to the ED 2/2 progressive dyspnea.  There, she was found to be in afib with RVR.  CXR showed no evidence of CHF or infiltrate.  She was given a dilt bolus and gtt with resultant hypotension and bradycardia.  Dilt gtt was d/c'd and she was treated with Ca gluconate and HR/BP stabilized.  She was then admitted for further eval.  Since admission, HR's have been variable in the low 100's to the 130's.  She is currently on lopressor  BID and heparin.  She is asymptomatic @ rest but becomes dyspneic with any activity.  Inpatient Medications  . B-complex with vitamin C  1 tablet Oral Daily  . calcium carbonate   1,250 mg Oral QPC lunch  . cholecalciferol  1,000 Units Oral QPC lunch  . feeding supplement (GLUCERNA SHAKE)  237 mL Oral BID BM  . furosemide  40 mg Intravenous BID  . insulin aspart  0-15 Units Subcutaneous TID WC  . insulin aspart  0-5 Units Subcutaneous QHS  . metoprolol tartrate  50 mg Oral BID  . mometasone-formoterol  2 puff Inhalation BID  . multivitamin with minerals  1 tablet Oral Daily  . polyethylene glycol  17 g Oral Daily  . saccharomyces boulardii  250 mg Oral BID  . simvastatin  10 mg Oral q1800  . sodium chloride  3 mL Intravenous Q12H  . tamsulosin  0.4 mg Oral Daily  . timolol  1 drop Both Eyes QHS   Family History Family History  Problem Relation Age of Onset  . Hypertension Mother     Social History History   Social History  . Marital Status: Divorced    Spouse Name: N/A    Number of Children: N/A  . Years of Education: N/A   Occupational History  . Not on file.   Social History Main Topics  . Smoking status: Former Games developer  . Smokeless tobacco: Not on file  . Alcohol Use: No  . Drug Use: No  . Sexual Activity: Not on file  Other Topics Concern  . Not on file   Social History Narrative  . No narrative on file    Review of Systems  General:  No chills, fever, night sweats or weight changes.  Cardiovascular:  No chest pain, +++ dyspnea on exertion, no edema, orthopnea, occas palpitations, no paroxysmal nocturnal dyspnea. Dermatological: No rash, lesions/masses Respiratory: No cough, dyspnea Urologic: No hematuria, dysuria Abdominal:   No nausea, vomiting, diarrhea, bright red blood per rectum, melena, or hematemesis Neurologic:  No visual changes, wkns, changes in mental status. All other systems reviewed and are otherwise negative except as noted above.  Physical Exam  Blood pressure 98/59, pulse 129, temperature 97.8 F (36.6 C), temperature source Oral, resp. rate 21, height 5' 2.5" (1.588 m), weight 171 lb 11.8 oz (77.9 kg), SpO2  97.00%.  General: Pleasant, NAD Psych: Normal affect. Neuro: Alert and oriented X 3. Moves all extremities spontaneously. HEENT: Normal  Neck: Supple without bruits or JVD. Lungs:  Resp regular and unlabored, few basilar crackles. Heart: IR, IR, distant, no s3, s4, or murmurs. Abdomen: Soft, non-tender, non-distended, BS + x 4.  Extremities: No clubbing, cyanosis or edema. Sep 28, 2014/Radials 2+ and equal bilaterally.  Labs   Recent Labs  2014/11/09 0116 Sep 28, 2014 0905  TROPONINI <0.30 <0.30   Lab Results  Component Value Date   WBC 13.3* 28-Sep-2014   HGB 11.1* 2014-09-28   HCT 34.2* 09-28-14   MCV 91.7 September 28, 2014   PLT 250 09-28-14    Recent Labs Lab September 28, 2014 0116  NA 131*  K 5.3  CL 95*  CO2 22  BUN 35*  CREATININE 0.90  CALCIUM 10.1  GLUCOSE 212*   Radiology/Studies  Dg Chest 2 View  09/17/2014   CLINICAL DATA:  Cough, shortness of Breath  EXAM: CHEST  2 VIEW  COMPARISON:  06/16/2014  FINDINGS: Cardiomegaly is noted. No acute infiltrate or pulmonary edema. Stable chronic interstitial prominence. Osteopenia and degenerative changes thoracic spine. Prior vertebroplasty lumbar spine.  IMPRESSION: No infiltrate or pulmonary edema. Cardiomegaly. Chronic interstitial prominence.   Electronically Signed   By: Natasha Mead M.D.   On: 09/17/2014 16:25   2D Echocardiogram 28-Sep-2014  Study Conclusions  - Left ventricle: The cavity size was normal. There was mild focal   basal hypertrophy of the septum. Systolic function was normal.   The estimated ejection fraction was in the range of 50% to 55%.   Regional wall motion abnormalities cannot be excluded. - Ventricular septum: Septal motion showed paradox. - Mitral valve: Calcified annulus. Moderately thickened, mildly   calcified leaflets . There was moderate regurgitation. - Left atrium: The atrium was moderately dilated. - Right atrium: The atrium was mildly dilated. - Tricuspid valve: There was moderate-severe regurgitation. -  Pulmonary arteries: PA peak pressure: 52 mm Hg (S). _____________   ECG  Afib, 90, lbbb  ASSESSMENT AND PLAN  1. Afib RVR:  Pt presented to the ED last night after a 2 day h/o progressive dyspnea w/o chest pain. She was found to be in afib with RVR.  She was not feeling palpitations thus exact onset/duration unknown.  Afib responded to dilt but she became bradycardic and hypotensive in the ED.  Rates are currently in the low 100's on oral bb.  CHA2DS2VASc = 5.  She uses a walker @ home but does not fall.  She is currently on heparin.  Recommend d/c heparin.  Add eliquis  BID (>80 but >60kg, creat<1.5), cont bb, and if she does not convert in the next  24 hrs, we can arrange for TEE/DCCV on Monday.  Volume improved, d/c lasix.  2.  HTN:  Stable.  Follow on bb.  3.  CKD III:  Stable.  Creat < 1.5.  Signed, Nicolasa Ducking, NP 10-06-14, 3:14 PM   Attending Note:   The patient was seen and examined.  Agree with assessment and plan as noted above.  Changes made to the above note as needed.  She seems to have decompensated over the past several days.  This may have been due to a-fib. Will DC heparin and start Eliquis.  She seems to be fairly stable and has not had a hx of falling.   She does use a walker.  Will continue rate control with metoprolol - titrate for rate control  Possible TEE cardioversion later this week.     Vesta Mixer, Montez Hageman., MD, [facility] 06-Oct-2014, 4:35 PM [address]  [address] Office [phone] Pager [phone]

## 2014-09-18 NOTE — H&P (Signed)
Patient's PCP: Lupita Raider, MD  Chief Complaint: Shortness of breath  History of Present Illness: Natalie Hicks is a 80 y.o. Caucasian female with history of hypertension, chronic kidney disease stage III, diabetes, urinary incontinence, and chronic back pain who presents with the above complaints.  Patient reports that her symptoms started about 2 days ago when she noted that she was gradually becoming more short of breath.  She did report that she had cough which was productive for mucus.  Due to her ongoing shortness of breath, she presented to the emergency department after having dinner last night.  She denies any recent fevers, chills, vomiting, chest pain, abdominal pain, diarrhea, headaches or vision changes.  She did admit to being nauseated but has not vomited.  When patient presented to [facility] she was found to be in atrial fibrillation with rapid ventricular rate.  She was given a diltiazem bolus which controlled her heart rate but also made her hypotensive.  She was given 2 doses of calcium gluconate and eventually her blood pressure improved.  Hospitalist service was asked to admit the patient for further care and management.  Review of Systems: All systems reviewed with the patient and positive as per history of present illness, otherwise all other systems are negative.  Past Medical History  Diagnosis Date  . Diabetes   . Chronic kidney disease   . Hypertension    Past Surgical History  Procedure Laterality Date  . Tonsillectomy    . Hemrhoidectomy     Family History  Problem Relation Age of Onset  . Hypertension Mother    History   Social History  . Marital Status: Divorced    Spouse Name: N/A    Number of Children: N/A  . Years of Education: N/A   Occupational History  . Not on file.   Social History Main Topics  . Smoking status: Former Games developer  . Smokeless tobacco: Not on file  . Alcohol Use: No  . Drug Use: No  . Sexual Activity: Not  on file   Other Topics Concern  . Not on file   Social History Narrative  . No narrative on file   Allergies: Actos; Avandia; Aspirin; Celecoxib; and Penicillins  Home Meds: Prior to Admission medications   Medication Sig Start Date End Date Taking? Authorizing Provider  Fluticasone-Salmeterol (ADVAIR) 100-50 MCG/DOSE AEPB Inhale 1 puff into the lungs 2 (two) times daily.   Yes Historical Provider, MD  furosemide (LASIX) 20 MG tablet Take 20 mg by mouth.   Yes Historical Provider, MD  levofloxacin (LEVAQUIN) 750 MG tablet Take 750 mg by mouth daily.   Yes Historical Provider, MD  acetaminophen (TYLENOL) 325 MG tablet Take 2 tablets (650 mg total) by mouth every 6 (six) hours as needed (or Fever >/= 101). 10/16/12   Cristal Ford, MD  atenolol (TENORMIN) 50 MG tablet Take 1 tablet (50 mg total) by mouth daily. 10/16/12   Kenneshia Rehm Cherlynn Kaiser, MD  B Complex-C (B-COMPLEX WITH VITAMIN C) tablet Take 1 tablet by mouth daily.    Historical Provider, MD  calcium carbonate (OS-CAL) 600 MG TABS Take 600 mg by mouth daily after lunch.    Historical Provider, MD  cholecalciferol (VITAMIN D) 1000 UNITS tablet Take 1,000 Units by mouth daily after lunch.    Historical Provider, MD  diazepam (VALIUM) 5 MG tablet Take 0.5 tablets (2.5 mg total) by mouth every 8 (eight) hours as needed for anxiety. 10/16/12   Ercie Eliasen Cherlynn Kaiser,  MD  feeding supplement (GLUCERNA SHAKE) LIQD Take 237 mLs by mouth 2 (two) times daily between meals. 10/16/12   Madeleyn Schwimmer Cherlynn Kaiser, MD  glyBURIDE-metformin (GLUCOVANCE) 2.5-500 MG per tablet Take 2 tablets by mouth 2 (two) times daily with a meal.    Historical Provider, MD  HYDROcodone-acetaminophen (NORCO/VICODIN) 5-325 MG per tablet Take 0.5-1 tablets by mouth every 6 (six) hours as needed. 10/16/12   Cristal Ford, MD  losartan (COZAAR) 100 MG tablet Take 100 mg by mouth every morning.    Historical Provider, MD  Multiple Vitamin (MULTIVITAMIN WITH MINERALS) TABS Take 1 tablet by mouth  daily.    Historical Provider, MD  polyethylene glycol (MIRALAX / GLYCOLAX) packet Take 17 g by mouth daily. 10/16/12   Symiah Nowotny Cherlynn Kaiser, MD  pravastatin (PRAVACHOL) 20 MG tablet Take 20 mg by mouth at bedtime.    Historical Provider, MD  saccharomyces boulardii (FLORASTOR) 250 MG capsule Take 1 capsule (250 mg total) by mouth 2 (two) times daily. For 2 weeks, if no diarrhea then discontinue. 10/16/12   Shaena Parkerson Cherlynn Kaiser, MD  senna-docusate (SENOKOT-S) 8.6-50 MG per tablet Take 1 tablet by mouth at bedtime as needed. 10/16/12   Yostin Malacara Cherlynn Kaiser, MD  Tamsulosin HCl (FLOMAX) 0.4 MG CAPS Take 1 capsule (0.4 mg total) by mouth daily. 10/16/12   Brieonna Crutcher Cherlynn Kaiser, MD  timolol (TIMOPTIC-XR) 0.5 % ophthalmic gel-forming Place 1 drop into both eyes at bedtime.    Historical Provider, MD    Physical Exam: Blood pressure 133/97, pulse 80, temperature 97.4 F (36.3 C), temperature source Oral, resp. rate 22, height 5' 2.5" (1.588 m), weight 77.9 kg (171 lb 11.8 oz), SpO2 96.00%. General: Awake, Oriented x3, No acute distress. HEENT: EOMI, Moist mucous membranes Neck: Supple CV: S1 and S2, irregularly irregular Lungs: Crackles at bases bilaterally, good air movement, no wheezing. Abdomen: Soft, Nontender, Nondistended, +bowel sounds. Ext: Good pulses. Trace edema. No clubbing or cyanosis noted. Neuro: Cranial Nerves II-XII grossly intact. Has 5/5 motor strength in upper and lower extremities.  Lab results:  Recent Labs  09/05/2014 0116  NA 131*  K 5.3  CL 95*  CO2 22  GLUCOSE 212*  BUN 35*  CREATININE 0.90  CALCIUM 10.1   No results found for this basename: AST, ALT, ALKPHOS, BILITOT, PROT, ALBUMIN,  in the last 72 hours No results found for this basename: LIPASE, AMYLASE,  in the last 72 hours  Recent Labs  09/20/2014 0116  WBC 13.3*  NEUTROABS 11.6*  HGB 11.1*  HCT 34.2*  MCV 91.7  PLT 250    Recent Labs  09/21/2014 0116  TROPONINI <0.30   No components found with this basename: POCBNP,   No results found for this basename: DDIMER,  in the last 72 hours No results found for this basename: HGBA1C,  in the last 72 hours No results found for this basename: CHOL, HDL, LDLCALC, TRIG, CHOLHDL, LDLDIRECT,  in the last 72 hours No results found for this basename: TSH, T4TOTAL, FREET3, T3FREE, THYROIDAB,  in the last 72 hours No results found for this basename: VITAMINB12, FOLATE, FERRITIN, TIBC, IRON, RETICCTPCT,  in the last 72 hours Imaging results:  Dg Chest 2 View  09/17/2014   CLINICAL DATA:  Cough, shortness of Breath  EXAM: CHEST  2 VIEW  COMPARISON:  06/16/2014  FINDINGS: Cardiomegaly is noted. No acute infiltrate or pulmonary edema. Stable chronic interstitial prominence. Osteopenia and degenerative changes thoracic spine. Prior vertebroplasty lumbar spine.  IMPRESSION: No infiltrate or  pulmonary edema. Cardiomegaly. Chronic interstitial prominence.   Electronically Signed   By: Natasha Mead M.D.   On: 09/17/2014 16:25   Other results: EKG: Left bundle branch block, Atrial fibrillation with RVR, heart rate 104.  Assessment & Plan by Problem: Acute respiratory failure with hypoxia Likely due to atrial fibrillation with rapid ventricular rate and subsequent congestive heart failure, check echocardiogram to assess patient's EF.  Chest x-ray does not show any signs in infectious etiology.  Diuresis the patient on Lasix 40 mg IV twice daily, monitor her volume status and renal function carefully.  Atrial fibrillation with a rapid ventricular rate Rate well controlled at this time.  Start patient on metoprolol 25 mg twice daily.  Request 2-D echocardiogram.  Monitor the patient on telemetry.  Cycle cardiac enzymes.  Patient would benefit from cardiology evaluation later today.  Patient's CHA2DS2-VASc score is 5 start patient on heparin drip to anticoagulate the patient and transition the patient to another anticoagulation depending on cardiology's recommendation.  Consider increasing  metoprolol status depending on patient's clinical course.  Hypertension Stable.  Decrease patient's dose of losartan.  Hold atenolol, started the patient on metoprolol.  Diabetes Hold patient's metformin/glyburide.  Moderate sliding scale insulin.  Diabetic diet.  Check hemoglobin A1c to assess degree of diabetic control.  Chronic kidney disease stage III Renal function at baseline.  Continue to monitor.  Prophylaxis Heparin drip.  CODE STATUS Full code.  This was discussed with the patient the time of admission.  Disposition Admit the patient to telemetry as inpatient.  Time spent on admission, talking to the patient, and coordinating care was: 50 mins.  Bradly Sangiovanni A, MD 09/21/2014, 6:19 AM

## 2014-09-18 NOTE — Progress Notes (Signed)
[facility] PROGRESS NOTE  Natalie Hicks YNW:[id number] DOB: 10/10/1925 DOA: 10-15-2014 PCP: Lupita Raider, MD  Assessment/Plan: Acute respiratory failure with hypoxia  -Likely due to atrial fibrillation with rapid ventricular rate and subsequent congestive heart failure -Cont on Lasix 40 mg IV twice daily Atrial fibrillation with a rapid ventricular rate  -Pt initially started on cardizem gtt, but blood pressure did not tolerate. -Beta blocker started, however, pt's bp did not tolerate either -Patient's CHA2DS2-VASc score is 5, started heparin drip to anticoagulate -Consider eliquis vs xarelto on d/c -As pt's HR remains difficult to control, consider Cardiology consult for assistance Hypertension  -Pt becomes hypotensive with cardizem and beta blockers Diabetes  - Holding patient's metformin/glyburide. - Cont sliding scale insulin Chronic kidney disease stage III  - Renal function at baseline.  -Continue to monitor  Code Status: Full Family Communication: Pt in room (indicate person spoken with, relationship, and if by phone, the number) Disposition Plan: Pending   Consultants:  Cardiology  Procedures:  none  Antibiotics:  none (indicate start date, and stop date if known)  HPI/Subjective: Pt tachycardic overnight. Pt complains of mild sob  Objective: Filed Vitals:   October 15, 2014 1045 October 15, 2014 1054 15-Oct-2014 1252 10-15-2014 1417  BP:  108/64 104/48 98/59  Pulse:  86 132 129  Temp:    97.8 F (36.6 C)  TempSrc:    Oral  Resp:  Height:      Weight:      SpO2: 97% 97% 96% 97%    Intake/Output Summary (Last 24 hours) at 15-Oct-2014 1514 Last data filed at October 15, 2014 1321  Gross per 24 hour  Intake    123 ml  Output    960 ml  Net   -837 ml   Filed Weights   2014/10/15 0548  Weight: 77.9 kg (171 lb 11.8 oz)    Exam:   General:  Awake, in nad  Cardiovascular: tachycardic, irregularly irregular, s1, s2  Respiratory: slightly increased resp  effort, no wheezing  Abdomen: soft, obese nondistended  Musculoskeletal: perfused, no clubbing   Data Reviewed: Basic Metabolic Panel:  Recent Labs Lab 15-Oct-2014 0116  NA 131*  Hicks 5.3  CL 95*  CO2 22  GLUCOSE 212*  BUN 35*  CREATININE 0.90  CALCIUM 10.1   Liver Function Tests: No results found for this basename: AST, ALT, ALKPHOS, BILITOT, PROT, ALBUMIN,  in the last 168 hours No results found for this basename: LIPASE, AMYLASE,  in the last 168 hours No results found for this basename: AMMONIA,  in the last 168 hours CBC:  Recent Labs Lab 10/15/14 0116  WBC 13.3*  NEUTROABS 11.6*  HGB 11.1*  HCT 34.2*  MCV 91.7  PLT 250   Cardiac Enzymes:  Recent Labs Lab 10/15/14 0116 Oct 15, 2014 0905  TROPONINI <0.30 <0.30   BNP (last 3 results)  Recent Labs  10-15-2014 0116  PROBNP 6065.0*   CBG:  Recent Labs Lab 15-Oct-2014 0659 2014/10/15 1141  GLUCAP 231* 162*    No results found for this or any previous visit (from the past 240 hour(s)).   Studies: Dg Chest 2 View  09/17/2014   CLINICAL DATA:  Cough, shortness of Breath  EXAM: CHEST  2 VIEW  COMPARISON:  06/16/2014  FINDINGS: Cardiomegaly is noted. No acute infiltrate or pulmonary edema. Stable chronic interstitial prominence. Osteopenia and degenerative changes thoracic spine. Prior vertebroplasty lumbar spine.  IMPRESSION: No infiltrate or pulmonary edema. Cardiomegaly. Chronic interstitial prominence.   Electronically Signed   By:  Natasha Mead M.D.   On: 09/17/2014 16:25    Scheduled Meds: . B-complex with vitamin C  1 tablet Oral Daily  . calcium carbonate  1,250 mg Oral QPC lunch  . cholecalciferol  1,000 Units Oral QPC lunch  . feeding supplement (GLUCERNA SHAKE)  237 mL Oral BID BM  . furosemide  40 mg Intravenous BID  . insulin aspart  0-15 Units Subcutaneous TID WC  . insulin aspart  0-5 Units Subcutaneous QHS  . metoprolol tartrate  50 mg Oral BID  . mometasone-formoterol  2 puff Inhalation BID  .  multivitamin with minerals  1 tablet Oral Daily  . polyethylene glycol  17 g Oral Daily  . saccharomyces boulardii  250 mg Oral BID  . simvastatin  10 mg Oral q1800  . sodium chloride  3 mL Intravenous Q12H  . tamsulosin  0.4 mg Oral Daily  . timolol  1 drop Both Eyes QHS   Continuous Infusions: . heparin 1,000 Units/hr (09/23/2014 0809)    Active Problems:   DM (diabetes mellitus)   CKD (chronic kidney disease)   Atrial fibrillation with rapid ventricular response   Acute respiratory failure with hypoxia   Hypertension   New onset a-fib  Time spent:  Natalie Hicks  [facility] Pager [phone]. If 7PM-7AM, please contact night-coverage at [email or web address], password [facility] 09/19/2014, 3:14 PM  LOS: 0 days

## 2014-09-18 NOTE — Evaluation (Signed)
Clinical/Bedside Swallow Evaluation Patient Details  Name: Natalie Hicks MRN: [id number] Date of Birth: 23-Jul-1925  Today's Date: 09/22/2014 Time: 1215-1235 SLP Time Calculation (min): 20 min  Past Medical History:  Past Medical History  Diagnosis Date  . Diabetes   . Chronic kidney disease   . Hypertension    Past Surgical History:  Past Surgical History  Procedure Laterality Date  . Tonsillectomy    . Hemrhoidectomy     HPI:  Pt is a 80 y.o.female with history of hypertension, chronic kidney disease stage III, diabetes, urinary incontinence, and chronic back pain who presented to hospital on 09/07/2014.  Patient reports that her symptoms started about 2 days ago when she noted that she was gradually becoming more short of breath.  She did report that she had cough which was productive for mucus. When patient presented to [facility] on 09/17/14, she was found to be in atrial fibrillation with rapid ventricular rate.  She was given a diltiazem bolus which controlled her heart rate but also made her hypotensive.  She was given 2 doses of calcium gluconate and eventually her blood pressure improved.  Pt denies any difficulty with swallowing at this time. CXR on 09/17/18 indicated no infiltrate or pulmonary edema; chronic interstitial prominence   Assessment / Plan / Recommendation Clinical Impression  Pt without overt s/s of aspiration during BSE with thin-solids; pt is significantly SOB which can impact swallowing intermittently without use of compensatory strategies including small sips/bites and no straws (pt does not use currently).  Oropharyngeal swallow appeared normal during BSE; nursing had no concerns with swallowing medications; recommend regular/thin with small sips/bites and medications whole with liquids; no further f/u with ST at this time    Aspiration Risk  Mild    Diet Recommendation Regular;Thin liquid   Liquid Administration via: Cup Medication  Administration: Whole meds with liquid (as tolerated) Supervision: Patient able to self feed Compensations: Slow rate;Small sips/bites Postural Changes and/or Swallow Maneuvers: Out of bed for meals;Seated upright 90 degrees;Upright 30-60 min after meal    Other  Recommendations n/a  Follow Up Recommendations  None    Frequency and Duration n/a     Pertinent Vitals/Pain Increased HR    SLP Swallow Goals  n/a   Swallow Study Prior Functional Status   Lives in assisted living facility    General Date of Onset: 09/10/2014 HPI: Pt is a 80 y.o.female with history of hypertension, chronic kidney disease stage III, diabetes, urinary incontinence, and chronic back pain who presented to hospital on 09/07/2014.  Patient reports that her symptoms started about 2 days ago when she noted that she was gradually becoming more short of breath.  She did report that she had cough which was productive for mucus. When patient presented to [facility] on 09/17/14, she was found to be in atrial fibrillation with rapid ventricular rate.  She was given a diltiazem bolus which controlled her heart rate but also made her hypotensive.  She was given 2 doses of calcium gluconate and eventually her blood pressure improved.  Pt denies any difficulty with swallowing at this time. Type of Study: Bedside swallow evaluation Previous Swallow Assessment: n/a Diet Prior to this Study: Regular;Thin liquids Temperature Spikes Noted: No Respiratory Status: Nasal cannula History of Recent Intubation: No Behavior/Cognition: Alert;Cooperative;Pleasant mood Oral Cavity - Dentition: Adequate natural dentition Self-Feeding Abilities: Able to feed self Patient Positioning: Upright in bed Baseline Vocal Quality: Low vocal intensity Volitional Cough: Strong;Congested Volitional Swallow:  Able to elicit    Oral/Motor/Sensory Function Overall Oral Motor/Sensory Function: Appears within functional limits for tasks  assessed Labial ROM: Within Functional Limits Labial Symmetry: Within Functional Limits Labial Strength: Within Functional Limits Labial Sensation: Within Functional Limits Lingual ROM: Within Functional Limits Lingual Symmetry: Within Functional Limits Lingual Strength: Within Functional Limits Lingual Sensation: Within Functional Limits Facial ROM: Within Functional Limits Facial Symmetry: Within Functional Limits Facial Strength: Within Functional Limits Facial Sensation: Within Functional Limits Velum: Within Functional Limits Mandible: Within Functional Limits   Ice Chips Ice chips: Not tested   Thin Liquid Thin Liquid: Within functional limits Presentation: Cup    Nectar Thick Nectar Thick Liquid: Not tested   Honey Thick Honey Thick Liquid: Not tested   Puree Puree: Within functional limits Presentation: Self Fed   Solid       Solid: Within functional limits Presentation: Self Fed       Tamana Hatfield,PAT, M.S., CCC-SLP 2014/09/20,2:02 PM

## 2014-09-18 NOTE — ED Notes (Signed)
Pt transferred from [facility] for sob.  Given advair and lasix, has not filled.

## 2014-09-18 NOTE — Plan of Care (Signed)
80 yo F with new onset A.Fib RVR and CHF.  Over-reacted to cardizem gtt when they put her on it dropping her rate into the 50s and pressure, thankfully responded to [facility] and is now normotensive and rate in the 70s-80s and feeling well.  Also given lasix as CXR demonstrates fluid overload / new CHF.  Accepted to tele as she is not on cardizem gtt at this time.

## 2014-09-19 ENCOUNTER — Inpatient Hospital Stay (HOSPITAL_COMMUNITY): Payer: Medicare Other

## 2014-09-19 DIAGNOSIS — J9600 Acute respiratory failure, unspecified whether with hypoxia or hypercapnia: Secondary | ICD-10-CM

## 2014-09-19 LAB — POCT I-STAT 3, ART BLOOD GAS (G3+)
Bicarbonate: 28.3 mEq/L — ABNORMAL HIGH (ref 20.0–24.0)
O2 Saturation: 93 %
PCO2 ART: 65.4 mmHg — AB (ref 35.0–45.0)
PH ART: 7.245 — AB (ref 7.350–7.450)
Patient temperature: 98.6
TCO2: 30 mmol/L (ref 0–100)
pO2, Arterial: 80 mmHg (ref 80.0–100.0)

## 2014-09-19 LAB — BASIC METABOLIC PANEL
Anion gap: 11 (ref 5–15)
BUN: 46 mg/dL — AB (ref 6–23)
CALCIUM: 9.7 mg/dL (ref 8.4–10.5)
CHLORIDE: 98 meq/L (ref 96–112)
CO2: 25 meq/L (ref 19–32)
CREATININE: 1.04 mg/dL (ref 0.50–1.10)
GFR calc Af Amer: 54 mL/min — ABNORMAL LOW (ref >90)
GFR calc non Af Amer: 47 mL/min — ABNORMAL LOW (ref >90)
Glucose, Bld: 192 mg/dL — ABNORMAL HIGH (ref 70–99)
Potassium: 4.7 mEq/L (ref 3.7–5.3)
Sodium: 134 mEq/L — ABNORMAL LOW (ref 137–147)

## 2014-09-19 LAB — CBC
HCT: 31.1 % — ABNORMAL LOW (ref 36.0–46.0)
HEMATOCRIT: 30.7 % — AB (ref 36.0–46.0)
Hemoglobin: 9.5 g/dL — ABNORMAL LOW (ref 12.0–15.0)
Hemoglobin: 9.7 g/dL — ABNORMAL LOW (ref 12.0–15.0)
MCH: 28.1 pg (ref 26.0–34.0)
MCH: 28.7 pg (ref 26.0–34.0)
MCHC: 30.9 g/dL (ref 30.0–36.0)
MCHC: 31.2 g/dL (ref 30.0–36.0)
MCV: 90.8 fL (ref 78.0–100.0)
MCV: 92 fL (ref 78.0–100.0)
Platelets: 241 10*3/uL (ref 150–400)
Platelets: 223 10*3/uL (ref 150–400)
RBC: 3.38 MIL/uL — ABNORMAL LOW (ref 3.87–5.11)
RBC: 3.38 MIL/uL — ABNORMAL LOW (ref 3.87–5.11)
RDW: 15 % (ref 11.5–15.5)
RDW: 14.9 % (ref 11.5–15.5)
WBC: 7.9 10*3/uL (ref 4.0–10.5)
WBC: 8.6 10*3/uL (ref 4.0–10.5)

## 2014-09-19 LAB — URINE MICROSCOPIC-ADD ON

## 2014-09-19 LAB — GLUCOSE, CAPILLARY
GLUCOSE-CAPILLARY: 89 mg/dL (ref 70–99)
GLUCOSE-CAPILLARY: 230 mg/dL — AB (ref 70–99)
GLUCOSE-CAPILLARY: 217 mg/dL — AB (ref 70–99)
Glucose-Capillary: 154 mg/dL — ABNORMAL HIGH (ref 70–99)

## 2014-09-19 LAB — BLOOD GAS, VENOUS
Acid-base deficit: 1.1 mmol/L (ref 0.0–2.0)
Bicarbonate: 26.5 mEq/L — ABNORMAL HIGH (ref 20.0–24.0)
DRAWN BY: 27022
O2 CONTENT: 3 L/min
O2 Saturation: 32.4 %
PCO2 VEN: 75.2 mmHg — AB (ref 45.0–50.0)
PH VEN: 7.173 — AB (ref 7.250–7.300)
Patient temperature: 98.6
TCO2: 28.8 mmol/L (ref 0–100)

## 2014-09-19 LAB — URINALYSIS, ROUTINE W REFLEX MICROSCOPIC
Bilirubin Urine: NEGATIVE
Glucose, UA: NEGATIVE mg/dL
Ketones, ur: NEGATIVE mg/dL
NITRITE: NEGATIVE
Protein, ur: NEGATIVE mg/dL
SPECIFIC GRAVITY, URINE: 1.013 (ref 1.005–1.030)
UROBILINOGEN UA: 0.2 mg/dL (ref 0.0–1.0)
pH: 5 (ref 5.0–8.0)

## 2014-09-19 LAB — MRSA PCR SCREENING: MRSA BY PCR: NEGATIVE

## 2014-09-19 LAB — MAGNESIUM: Magnesium: 1.9 mg/dL (ref 1.5–2.5)

## 2014-09-19 LAB — HEPARIN LEVEL (UNFRACTIONATED): Heparin Unfractionated: 0.47 IU/mL (ref 0.30–0.70)

## 2014-09-19 MED ORDER — LEVALBUTEROL HCL 0.63 MG/3ML IN NEBU
0.6300 mg | INHALATION_SOLUTION | Freq: Four times a day (QID) | RESPIRATORY_TRACT | Status: DC
  Administered 2014-09-19: 0.63 mg via RESPIRATORY_TRACT
  Filled 2014-09-19: qty 3

## 2014-09-19 MED ORDER — CIPROFLOXACIN IN D5W 200 MG/100ML IV SOLN
200.0000 mg | Freq: Two times a day (BID) | INTRAVENOUS | Status: DC
  Administered 2014-09-20 – 2014-09-22 (×5): 200 mg via INTRAVENOUS
  Filled 2014-09-19 (×6): qty 100

## 2014-09-19 MED ORDER — AMIODARONE HCL IN DEXTROSE 360-4.14 MG/200ML-% IV SOLN
60.0000 mg/h | INTRAVENOUS | Status: AC
  Administered 2014-09-19: 60 mg/h via INTRAVENOUS
  Filled 2014-09-19: qty 200

## 2014-09-19 MED ORDER — CIPROFLOXACIN IN D5W 200 MG/100ML IV SOLN
200.0000 mg | INTRAVENOUS | Status: AC
  Administered 2014-09-19: 200 mg via INTRAVENOUS
  Filled 2014-09-19: qty 100

## 2014-09-19 MED ORDER — SODIUM CHLORIDE 0.9 % IV SOLN
INTRAVENOUS | Status: DC
  Administered 2014-09-20 – 2014-09-22 (×2): via INTRAVENOUS

## 2014-09-19 MED ORDER — DIGOXIN 0.25 MG/ML IJ SOLN
0.1250 mg | Freq: Once | INTRAMUSCULAR | Status: DC
  Filled 2014-09-19 (×2): qty 0.5

## 2014-09-19 MED ORDER — AMIODARONE HCL IN DEXTROSE 360-4.14 MG/200ML-% IV SOLN
30.0000 mg/h | INTRAVENOUS | Status: DC
  Administered 2014-09-19 – 2014-09-24 (×10): 30 mg/h via INTRAVENOUS
  Filled 2014-09-19 (×21): qty 200

## 2014-09-19 NOTE — Progress Notes (Signed)
[facility] PROGRESS NOTE  Natalie Hicks MVH:[id number] DOB: 09/09/1925 DOA: 09/22/2014 PCP: Lupita Raider, MD  Assessment/Plan: Acute respiratory failure with hypoxia  -Likely due to atrial fibrillation with rapid ventricular rate and subsequent congestive heart failure -Cont on Lasix 40 mg IV twice daily Atrial fibrillation with a rapid ventricular rate  -Pt initially started on cardizem gtt, but blood pressure did not tolerate. -Beta blocker started, however, pt's bp did not tolerate either -Patient's CHA2DS2-VASc score is 5, initially started heparin drip -As pt's HR remains difficult to control, Cardiology was consulted -Pt started on amiodarone -Plans for TEE with possible cardioversion tomorrow Hypertension  -Pt becomes hypotensive with cardizem and beta blockers Diabetes  - Holding patient's metformin/glyburide. - Cont sliding scale insulin Chronic kidney disease stage III  - Renal function at baseline.  -Continue to monitor  Code Status: Full Family Communication: Pt in room Disposition Plan: Pending   Consultants:  Cardiology  Procedures:  none  Antibiotics:  none  HPI/Subjective: Slightly confused today.  Objective: Filed Vitals:   09/19/14 0709 09/19/14 1121 09/19/14 1342 09/19/14 1344  BP:  115/58 97/43   Pulse:  121 106   Temp:   98.3 F (36.8 C)   TempSrc:   Oral   Resp:  18 20   Height:      Weight: 79.5 kg (175 lb 4.3 oz)     SpO2:  96% 100% 98%    Intake/Output Summary (Last 24 hours) at 09/19/14 1626 Last data filed at 09/19/14 1331  Gross per 24 hour  Intake 838.28 ml  Output   2125 ml  Net -1286.72 ml   Filed Weights   09/03/2014 0548 09/19/14 0709  Weight: 77.9 kg (171 lb 11.8 oz) 79.5 kg (175 lb 4.3 oz)    Exam:   General:  Awake, in nad  Cardiovascular: tachycardic, irregularly irregular, s1, s2  Respiratory: slightly increased resp effort, no wheezing  Abdomen: soft, obese nondistended  Musculoskeletal:  perfused, no clubbing   Data Reviewed: Basic Metabolic Panel:  Recent Labs Lab 09/03/2014 0116 09/07/2014 1503 09/19/14 0545  NA 131*  --  134*  K 5.3  --  4.7  CL 95*  --  98  CO2 22  --  25  GLUCOSE 212*  --  192*  BUN 35*  --  46*  CREATININE 0.90  --  1.04  CALCIUM 10.1  --  9.7  MG  --  1.3* 1.9   Liver Function Tests: No results found for this basename: AST, ALT, ALKPHOS, BILITOT, PROT, ALBUMIN,  in the last 168 hours No results found for this basename: LIPASE, AMYLASE,  in the last 168 hours No results found for this basename: AMMONIA,  in the last 168 hours CBC:  Recent Labs Lab 09/23/2014 0116 09/19/14 0545  WBC 13.3* 7.9  NEUTROABS 11.6*  --   HGB 11.1* 9.5*  HCT 34.2* 30.7*  MCV 91.7 90.8  PLT 250 241   Cardiac Enzymes:  Recent Labs Lab 09/10/2014 0116 09/21/2014 0905 09/27/2014 1503  TROPONINI <0.30 <0.30 <0.30   BNP (last 3 results)  Recent Labs  09/03/2014 0116  PROBNP 6065.0*   CBG:  Recent Labs Lab 09/27/2014 1657 09/17/2014 1724 09/08/2014 2127 09/19/14 0611 09/19/14 1203  GLUCAP 120* 112* 143* 154* 89    No results found for this or any previous visit (from the past 240 hour(s)).   Studies: No results found.  Scheduled Meds: . B-complex with vitamin C  1 tablet Oral Daily  .  calcium carbonate  1,250 mg Oral QPC lunch  . cholecalciferol  1,000 Units Oral QPC lunch  . feeding supplement (GLUCERNA SHAKE)  237 mL Oral BID BM  . furosemide  40 mg Intravenous BID  . insulin aspart  0-15 Units Subcutaneous TID WC  . insulin aspart  0-5 Units Subcutaneous QHS  . metoprolol tartrate  50 mg Oral BID  . mometasone-formoterol  2 puff Inhalation BID  . multivitamin with minerals  1 tablet Oral Daily  . polyethylene glycol  17 g Oral Daily  . saccharomyces boulardii  250 mg Oral BID  . simvastatin  10 mg Oral q1800  . sodium chloride  3 mL Intravenous Q12H  . tamsulosin  0.4 mg Oral Daily  . timolol  1 drop Both Eyes QHS   Continuous  Infusions: . amiodarone 30 mg/hr (09/19/14 1619)  . heparin 1,000 Units/hr (09/19/14 0427)    Active Problems:   DM (diabetes mellitus)   CKD (chronic kidney disease)   Atrial fibrillation with rapid ventricular response   Acute respiratory failure with hypoxia   Hypertension   New onset a-fib  Time spent:  CHIU, STEPHEN K  [facility] Pager [phone]. If 7PM-7AM, please contact night-coverage at [email or web address], password [facility] 09/19/2014, 4:26 PM  LOS: 1 day

## 2014-09-19 NOTE — Progress Notes (Signed)
Noted patient to be increasingly drowsy throughout morning, hands pale and nailbeds duskier than at initial assessment.  Pupils equal, round, and reactive to light bilaterally.  Patient oriented x3, disoriented to time, change from alert and oriented x4 at morning assessment.  MD aware and at bedside for assessment.  Unable to obtain consent from patient for cardioversion tomorrow due to AMS.  MD and this RN unable to reach son at this time.  HR in 80s-110s after amiodarone gtt started this AM.  Patient denies any questions or concerns.  Will continue to monitor.

## 2014-09-19 NOTE — Progress Notes (Signed)
Rapid Response RN called to bedside as patient's venous blood gas was abnormal.  (see Rapid Response RN note) MD notified of results.  Patient transferred to 2H14, report given to [facility], RN prior to transfer.  Rapid Response RN, charge RN, and this RN transported patient to unit.

## 2014-09-19 NOTE — Progress Notes (Signed)
eLink Physician-Brief Progress Note Patient Name: Natalie Hicks DOB: 14-Jun-1925 MRN: [id number]   Date of Service  09/19/2014  HPI/Events of Note  This pt brought down from floor. ? Intubation, but pt talking in sentences and in no distress.  eICU Interventions  obs in icu setting for now.  Full pccm note to follow     Intervention Category Evaluation Type: New Patient Evaluation  Shan Levans 09/19/2014, 7:43 PM

## 2014-09-19 NOTE — Progress Notes (Addendum)
Subjective: Denies SOB at rest  No CP  Weak.   Objective: Filed Vitals:   Sep 23, 2014 2100 09/19/14 0136 09/19/14 0500 09/19/14 0709  BP:  111/56 110/50   Pulse:  60 74   Temp:  97.9 F (36.6 C) 97.5 F (36.4 C)   TempSrc:  Oral Oral   Resp:  19 18   Height:      Weight:    175 lb 4.3 oz (79.5 kg)  SpO2: 97% 98% 99%    Weight change:   Intake/Output Summary (Last 24 hours) at 09/19/14 0741 Last data filed at 09/19/14 0422  Gross per 24 hour  Intake    243 ml  Output   2200 ml  Net  -1957 ml   Net neg 2.2 L   General: Alert, awake, oriented x3, in no acute distress Neck:  JVP is normal Heart: Irregular rate and rhythm, without murmurs, rubs, gallops.  Lungs: Decreased airflow  Rales at bases.   Exemities:  Tr edema.   Neuro: Grossly intact, nonfocal.  Tele:  Afib 110s to 130s   Lab Results: Results for orders placed during the hospital encounter of 2014-09-23 (from the past 24 hour(s))  TROPONIN I     Status: None   Collection Time    Sep 23, 2014  9:05 AM      Result Value Ref Range   Troponin I <0.30  <0.30 ng/mL  HEMOGLOBIN A1C     Status: Abnormal   Collection Time    September 23, 2014  9:05 AM      Result Value Ref Range   Hemoglobin A1C 6.3 (*) <5.7 %   Mean Plasma Glucose 134 (*) <117 mg/dL  GLUCOSE, CAPILLARY     Status: Abnormal   Collection Time    09/23/14 11:41 AM      Result Value Ref Range   Glucose-Capillary 162 (*) 70 - 99 mg/dL  TROPONIN I     Status: None   Collection Time    2014/09/23  3:03 PM      Result Value Ref Range   Troponin I <0.30  <0.30 ng/mL  HEPARIN LEVEL (UNFRACTIONATED)     Status: None   Collection Time    09/23/2014  3:03 PM      Result Value Ref Range   Heparin Unfractionated 0.39  0.30 - 0.70 IU/mL  MAGNESIUM     Status: Abnormal   Collection Time    09/23/2014  3:03 PM      Result Value Ref Range   Magnesium 1.3 (*) 1.5 - 2.5 mg/dL  GLUCOSE, CAPILLARY     Status: Abnormal   Collection Time    Sep 23, 2014  4:57 PM      Result Value  Ref Range   Glucose-Capillary 120 (*) 70 - 99 mg/dL  GLUCOSE, CAPILLARY     Status: Abnormal   Collection Time    2014/09/23  5:24 PM      Result Value Ref Range   Glucose-Capillary 112 (*) 70 - 99 mg/dL  GLUCOSE, CAPILLARY     Status: Abnormal   Collection Time    09/23/14  9:27 PM      Result Value Ref Range   Glucose-Capillary 143 (*) 70 - 99 mg/dL  TSH     Status: None   Collection Time    09/23/14 10:45 PM      Result Value Ref Range   TSH 1.800  0.350 - 4.500 uIU/mL  HEPARIN LEVEL (UNFRACTIONATED)     Status: None  Collection Time    09/19/2014 10:45 PM      Result Value Ref Range   Heparin Unfractionated 0.60  0.30 - 0.70 IU/mL  BASIC METABOLIC PANEL     Status: Abnormal   Collection Time    09/19/14  5:45 AM      Result Value Ref Range   Sodium 134 (*) 137 - 147 mEq/L   Potassium 4.7  3.7 - 5.3 mEq/L   Chloride 98  96 - 112 mEq/L   CO2 25  19 - 32 mEq/L   Glucose, Bld 192 (*) 70 - 99 mg/dL   BUN 46 (*) 6 - 23 mg/dL   Creatinine, Ser 4.09  0.50 - 1.10 mg/dL   Calcium 9.7  8.4 - 81.1 mg/dL   GFR calc non Af Amer 47 (*) >90 mL/min   GFR calc Af Amer 54 (*) >90 mL/min   Anion gap 11  5 - 15  CBC     Status: Abnormal   Collection Time    09/19/14  5:45 AM      Result Value Ref Range   WBC 7.9  4.0 - 10.5 K/uL   RBC 3.38 (*) 3.87 - 5.11 MIL/uL   Hemoglobin 9.5 (*) 12.0 - 15.0 g/dL   HCT 91.4 (*) 78.2 - 95.6 %   MCV 90.8  78.0 - 100.0 fL   MCH 28.1  26.0 - 34.0 pg   MCHC 30.9  30.0 - 36.0 g/dL   RDW 21.3  08.6 - 57.8 %   Platelets 241  150 - 400 K/uL  HEPARIN LEVEL (UNFRACTIONATED)     Status: None   Collection Time    09/19/14  5:45 AM      Result Value Ref Range   Heparin Unfractionated 0.47  0.30 - 0.70 IU/mL  MAGNESIUM     Status: None   Collection Time    09/19/14  5:45 AM      Result Value Ref Range   Magnesium 1.9  1.5 - 2.5 mg/dL  GLUCOSE, CAPILLARY     Status: Abnormal   Collection Time    09/19/14  6:11 AM      Result Value Ref Range    Glucose-Capillary 154 (*) 70 - 99 mg/dL    Studies/Results: No results found.  Echo 9.19.15  Study Conclusions  - Left ventricle: The cavity size was normal. There was mild focal basal hypertrophy of the septum. Systolic function was normal. The estimated ejection fraction was in the range of 50% to 55%. Regional wall motion abnormalities cannot be excluded. - Ventricular septum: Septal motion showed paradox. - Mitral valve: Calcified annulus. Moderately thickened, mildly calcified leaflets . There was moderate regurgitation. - Left atrium: The atrium was moderately dilated. - Right atrium: The atrium was mildly dilated. - Tricuspid valve: There was moderate-severe regurgitation. - Pulmonary arteries: PA peak pressure: 52 mm Hg (S).  Transthoracic echocardiography. M-mode, complete 2D, spectral Doppler, and color Doppler. Birthdate: Patient birthdate: 1925/06/17. Age: Patient is 80 yr old. Sex: Gender: female. BMI: 32.3 kg/m^2. Blood pressure: 133/97 Patient status: Inpatient. Study date: Study date: 09/16/2014. Study time: 10:09 AM. Location: Bedside.  -------------------------------------------------------------------  ------------------------------------------------------------------- Left ventricle: The cavity size was normal. There was mild focal basal hypertrophy of the septum. Systolic function was normal. The estimated ejection fraction was in the range of 50% to 55%. Regional wall motion abnormalities cannot be excluded. The study was not technically sufficient to allow evaluation of LV diastolic dysfunction due to atrial fibrillation.  -------------------------------------------------------------------  Aortic valve: Trileaflet; mildly thickened leaflets. Cusp separation was normal. Doppler: Transvalvular velocity was within the normal range. There was no stenosis. There was no  significant regurgitation.  ------------------------------------------------------------------- Aorta: The aorta was normal, not dilated, and non-diseased.  ------------------------------------------------------------------- Mitral valve: Calcified annulus. Moderately thickened, mildly calcified leaflets . Doppler: There was no evidence for stenosis. There was moderate regurgitation.  ------------------------------------------------------------------- Left atrium: The atrium was moderately dilated.  ------------------------------------------------------------------- Right ventricle: The cavity size was normal. Wall thickness was normal. Systolic function was normal.  ------------------------------------------------------------------- Ventricular septum: Septal motion showed paradox.  ------------------------------------------------------------------- Pulmonic valve: The valve appears to be grossly normal. Doppler: There was no significant regurgitation.  ------------------------------------------------------------------- Tricuspid valve: Structurally normal valve. Leaflet separation was normal. Doppler: Transvalvular velocity was within the normal range. There was moderate-severe regurgitation.  ------------------------------------------------------------------- Right atrium: The atrium was mildly dilated.  ------------------------------------------------------------------- Pericardium: The pericardium was normal in appearance.  -------------------------------------------------------------------    Medications: {REviewed   @  1.  Afib  Rates remain uncontrolled  On heparin.  Has not tolerated meds well because of BP  WIll start amio infusion (no bolus) and follow BP   Will plan TEE with possible cardioversion tomorrow.    2.  HTN  See above  Low at times.    3  Acute distolic CHF  Prob due to 1  Diuresing  Follow exam and renal function.     4.  Anemia  Hgb  is 9.5   From 11  Will need close follow up  Check stools  Commiting to anticoag with cardioversion.    LOS: 1 day   Dietrich Pates 09/19/2014, 7:41 AM

## 2014-09-19 NOTE — Progress Notes (Signed)
ANTICOAGULATION CONSULT NOTE - Initial Consult  Pharmacy Consult for Heparin Indication: atrial fibrillation  Allergies  Allergen Reactions  . Actos [Pioglitazone] Anaphylaxis  . Avandia [Rosiglitazone] Shortness Of Breath  . Aspirin Other (See Comments)    Reaction: cramping and GI upset  . Celecoxib Other (See Comments)    Reaction: GI upset  . Penicillins Hives    Patient Measurements: Height: 5' 2.5" (158.8 cm) Weight: 175 lb 4.3 oz (79.5 kg) (bed) IBW/kg (Calculated) : 51.25 Heparin Dosing Weight: 70 kg  Vital Signs: Temp: 97.5 F (36.4 C) (09/20 0500) Temp src: Oral (09/20 0500) BP: 115/58 mmHg (09/20 1121) Pulse Rate: 121 (09/20 1121)  Labs:  Recent Labs  10/15/14 0116 10/15/2014 0905 2014/10/15 1503 10/15/2014 2245 09/19/14 0545  HGB 11.1*  --   --   --  9.5*  HCT 34.2*  --   --   --  30.7*  PLT 250  --   --   --  241  HEPARINUNFRC  --   --  0.39 0.60 0.47  CREATININE 0.90  --   --   --  1.04  TROPONINI <0.30 <0.30 <0.30  --   --     Estimated Creatinine Clearance: 37.7 ml/min (by C-G formula based on Cr of 1.04).  Assessment: 80 yo female on IV heparin for new afib, Heparin level 0.47 on 1000 units/hr. Plan for TEE cardioversion tomorrow. Likely will switch to Xarelto vs. Eliquis at discharge. Hgb 9.5, slightly down, plt wnl, no bleeding noted, checking FOB.  Goal of Therapy:  Heparin level 0.3-0.7 units/ml Monitor platelets by anticoagulation protocol: Yes   Plan:  Continue heparin 1000 units/hr F/u AM heparin level and CBC F/u plans for oral anticoagulation.  Bayard Hugger, PharmD, BCPS  Clinical Pharmacist  Pager: [phone]  09/19/2014,12:07 PM

## 2014-09-19 NOTE — Progress Notes (Signed)
Pt. Continues to refuse BIPAP when asked to wear it. Pt. States "I will not wear it" RN is aware that pt. Is continuing to refuse.

## 2014-09-19 NOTE — Progress Notes (Signed)
ANTIBIOTIC CONSULT NOTE - INITIAL  Pharmacy Consult for Cipro Indication: UTI  Allergies  Allergen Reactions  . Actos [Pioglitazone] Anaphylaxis  . Avandia [Rosiglitazone] Shortness Of Breath  . Aspirin Other (See Comments)    Reaction: cramping and GI upset  . Celecoxib Other (See Comments)    Reaction: GI upset  . Penicillins Hives    Patient Measurements: Height: 5' 2.5" (158.8 cm) Weight: 175 lb 4.3 oz (79.5 kg) (bed) IBW/kg (Calculated) : 51.25  Vital Signs: Temp: 98.3 F (36.8 C) (09/20 1342) Temp src: Oral (09/20 1342) BP: 97/43 mmHg (09/20 1342) Pulse Rate: 106 (09/20 1342) Intake/Output from previous day: 09/19 0701 - 09/20 0700 In: 243 [P.O.:240; I.V.:3] Out: 2200 [Urine:2200] Intake/Output from this shift: Total I/O In: 718.3 [P.O.:360; I.V.:358.3] Out: 625 [Urine:625]  Labs:  Recent Labs  10-06-14 0116 09/19/14 0545  WBC 13.3* 7.9  HGB 11.1* 9.5*  PLT 250 241  CREATININE 0.90 1.04   Estimated Creatinine Clearance: 37.7 ml/min (by C-G formula based on Cr of 1.04). No results found for this basename: VANCOTROUGH, VANCOPEAK, VANCORANDOM, GENTTROUGH, GENTPEAK, GENTRANDOM, TOBRATROUGH, TOBRAPEAK, TOBRARND, AMIKACINPEAK, AMIKACINTROU, AMIKACIN,  in the last 72 hours   Microbiology: No results found for this or any previous visit (from the past 720 hour(s)).  Medical History: Past Medical History  Diagnosis Date  . Diabetes   . Chronic kidney disease   . Hypertension   . A-fib     a. Dx 08/2014, CHA2DS2VASc = 5;  b. 08/2014 Echo: EF 50-55%, mod MR, mod dil LA, mod-sev TR, PASP ..    Medications:  Prescriptions prior to admission  Medication Sig Dispense Refill  . acetaminophen (TYLENOL) 325 MG tablet Take 650 mg by mouth every 6 (six) hours as needed for mild pain.      Marland Kitchen atenolol-chlorthalidone (TENORETIC) 50-25 MG per tablet Take 1 tablet by mouth at bedtime.      . B Complex-C (B-COMPLEX WITH VITAMIN C) tablet Take 1 tablet by mouth  daily.      . calcium carbonate (OS-CAL) 600 MG TABS Take 600 mg by mouth daily after lunch.      . cetirizine (ZYRTEC) 10 MG tablet Take 10 mg by mouth daily as needed for allergies or rhinitis.      . cholecalciferol (VITAMIN D) 1000 UNITS tablet Take 1,000 Units by mouth daily after lunch.      . docusate sodium (COLACE) 100 MG capsule Take 100 mg by mouth 2 (two) times daily as needed for mild constipation.      . Fluticasone-Salmeterol (ADVAIR) 250-50 MCG/DOSE AEPB Inhale 1 puff into the lungs 2 (two) times daily.      Marland Kitchen glyBURIDE (DIABETA) 2.5 MG tablet Take 2.5 mg by mouth daily.      Marland Kitchen guaiFENesin (MUCINEX) 600 MG 12 hr tablet Take 1,200 mg by mouth 2 (two) times daily as needed (for congestion).      Marland Kitchen guaiFENesin (ROBITUSSIN) 100 MG/5ML SOLN Take 5 mLs by mouth every 6 (six) hours as needed for cough or to loosen phlegm.      . loperamide (IMODIUM) 2 MG capsule Take 2 mg by mouth as needed for diarrhea or loose stools.      Marland Kitchen losartan (COZAAR) 100 MG tablet Take 100 mg by mouth every morning.      . metFORMIN (GLUCOPHAGE) 1000 MG tablet Take 1,000 mg by mouth 2 (two) times daily with a meal.      . pravastatin (PRAVACHOL) 20 MG tablet Take 20  mg by mouth at bedtime.      . tamsulosin (FLOMAX) 0.4 MG CAPS capsule Take 0.4 mg by mouth daily as needed (for bladder spasms).      . timolol (TIMOPTIC-XR) 0.5 % ophthalmic gel-forming Place 1 drop into both eyes at bedtime.       Assessment: 80 y/o female to begin Cipro for UTI. UA is +. Urin culture is pending, she is afebrile, and WBC are normal.  Goal of Therapy:  Eradication of infection  Plan:  - Cipro 200 mg IV q12h - Monitor renal function and adjust dose as needed  [employer], [address].D., BCPS Clinical Pharmacist Pager: [phone] 09/19/2014 6:24 PM

## 2014-09-19 NOTE — Progress Notes (Signed)
[facility] hospitalist progress note. Chief complaint. Followup respiratory acidosis and hypoxia. History of present illness. 80 year old female in hospital with respiratory failure and hypoxia secondary to atrial fibrillation with RVR. Increased rate and subsequent congestive heart failure. Patient developed lethargy and hypoxia on the floor and a mixed venous/arterial ABG result indicated pH 7.173, PCO2 75.2, PO2 rate critical result and bicarbonate 26.5. The patient was moved to step down with plans for either BiPAP or possible intubation. Repeat ABG obtained shows pH 7.245, PCO2 65.4, PO2 80, bicarbonate 20.3. Came to see the patient at bedside to further assess her stability. I find patient alert and conversational. She is no apparent dyspnea. Speaking in full sentences without dyspnea. Vital signs. Temperature 98.4, pulse 88, respiration 21, blood pressure 96/31. O2 sats 93%. General appearance. Well-developed elderly female who is alert, pleasant, and in no distress. Denies shortness of breath. Cardiac. Rate and regular. Lungs. Few scattered crackles are generally diminished sounds throughout the lung fields O2 sat stable on room air. Abdomen. Soft with positive bowel sounds. Impression/plan Problem #1. Respiratory acidosis and hypoxia. Repeat ABG improved but certainly not normal. It appears critical-care has assumed care of this patient and I will defer any decision regarding BiPAP or possible intubation to them. Patient clinically appears relatively stable at this point despite blood gas results. May benefit from some Lasix but again defer to critical care.

## 2014-09-19 NOTE — Significant Event (Signed)
Rapid Response Event Note  Overview:  Called to assist with patient with abnormal ABG Time Called: 1815 Arrival Time: 1820 Event Type: Neurologic;Respiratory  Initial Focused Assessment:  On arrival will arouse skin cool and dry - answers questions - seems appropriate - pale - follows commands - bil BS = clear - abd large soft denies pain - does endorse SOB only with activity she states.  No JVD - irregular pulse - documented afib with this admission - on Heparin and Amiodarone.  BP 84/56 HR 78 RR 20 - regular - somewhat shallow - will DB on command - denies pain.  ABG is venous gas - pH and PCO2 concerning.     Interventions:  Dr. Rhona Leavens to bedside.  Patient talking - full sentences - but will fall asleep without stimulation- although easy to arouse.  Repositioned in bed - continued stimulation - will talk.  Attempted BIPAP - patient refusing to wear - states she does not want it.  Dr. Rhona Leavens paged - concerned that if she does not wear BIpap could be high risk for intubation.  He spoke with CCM who agreed to watch in ICU. Stayed with patient - talking to try to blow off pCo2 - patient continues to state she does not want tubes - spoke with son Rosanne Ashing who confirms that she is a full code - they have talked about it and would be ok with ETT.  transferred to [facility] = handoff to [facility] .  Bp 98/62 HR 78 RR 20  - arterial blood obtained per Terri RT.   Event Summary: Name of Physician Notified: Dr. Rhona Leavens at  (prior to rrt)  Name of Consulting Physician Notified: Dr. Delford Field at [phone] (by Dr. Rhona Leavens)     Event End Time: 1945  [place]

## 2014-09-19 NOTE — Progress Notes (Signed)
Cardiology on call notified as patient's heart rate sustaining in 120s-140s.  Patient remains short of breath and feels "weak".  Will await further orders and continue to monitor.

## 2014-09-19 NOTE — Progress Notes (Addendum)
Urinalysis ordered by MD, shows many bacteria, MD notified; culture ordered.  Cardiology MD on call notified as patient not alert and oriented x4 and thereby unable to sign consent for cardioversion in AM, relayed info about patient's dusky fingers.  ABG ordered by MD.  Respiratory notified and at bedside to draw ABG.  Patient on 4L of oxygen via nasal cannula and maintaining sats in mid 90s.  Will continue to monitor.

## 2014-09-19 NOTE — Consult Note (Signed)
PULMONARY / CRITICAL CARE MEDICINE   Name: GENEVEIVE FURNESS MRN: [id number] DOB: 1925/11/14    ADMISSION DATE:  09/11/2014   INITIAL PRESENTATION:  89F admitted to [facility] service 9/19 with new onset AF with RVR and dyspnea due to CHF. Transferred to ICU/SDU 9/20 with increased SOB. Did not require vent support  STUDIES:  9/19 TTE: LVEF 50-55%. LA moderately dilated. RA mildly dilated. PASP est 52 mmHg  SIGNIFICANT EVENTS:    HISTORY OF PRESENT ILLNESS:   Admission history reviewed. Transferred to ICU/SDU due to lethargy and abnormal ABG with intention of implementing NPPV. However, on arrival, pt was fully alert and in no respiratory distress. She denies dyspnea and chest pain  PAST MEDICAL HISTORY :  Past Medical History  Diagnosis Date  . Diabetes   . Chronic kidney disease   . Hypertension   . A-fib     a. Dx 08/2014, CHA2DS2VASc = 5;  b. 08/2014 Echo: EF 50-55%, mod MR, mod dil LA, mod-sev TR, PASP .Marland Kitchen   Past Surgical History  Procedure Laterality Date  . Tonsillectomy    . Hemrhoidectomy     Prior to Admission medications   Medication Sig Start Date End Date Taking? Authorizing Provider  acetaminophen (TYLENOL) 325 MG tablet Take 650 mg by mouth every 6 (six) hours as needed for mild pain.   Yes Historical Provider, MD  atenolol-chlorthalidone (TENORETIC) 50-25 MG per tablet Take 1 tablet by mouth at bedtime.   Yes Historical Provider, MD  B Complex-C (B-COMPLEX WITH VITAMIN C) tablet Take 1 tablet by mouth daily.   Yes Historical Provider, MD  calcium carbonate (OS-CAL) 600 MG TABS Take 600 mg by mouth daily after lunch.   Yes Historical Provider, MD  cetirizine (ZYRTEC) 10 MG tablet Take 10 mg by mouth daily as needed for allergies or rhinitis.   Yes Historical Provider, MD  cholecalciferol (VITAMIN D) 1000 UNITS tablet Take 1,000 Units by mouth daily after lunch.   Yes Historical Provider, MD  docusate sodium (COLACE) 100 MG capsule Take 100 mg by mouth 2 (two)  times daily as needed for mild constipation.   Yes Historical Provider, MD  Fluticasone-Salmeterol (ADVAIR) 250-50 MCG/DOSE AEPB Inhale 1 puff into the lungs 2 (two) times daily.   Yes Historical Provider, MD  glyBURIDE (DIABETA) 2.5 MG tablet Take 2.5 mg by mouth daily.   Yes Historical Provider, MD  guaiFENesin (MUCINEX) 600 MG 12 hr tablet Take 1,200 mg by mouth 2 (two) times daily as needed (for congestion).   Yes Historical Provider, MD  guaiFENesin (ROBITUSSIN) 100 MG/5ML SOLN Take 5 mLs by mouth every 6 (six) hours as needed for cough or to loosen phlegm.   Yes Historical Provider, MD  loperamide (IMODIUM) 2 MG capsule Take 2 mg by mouth as needed for diarrhea or loose stools.   Yes Historical Provider, MD  losartan (COZAAR) 100 MG tablet Take 100 mg by mouth every morning.   Yes Historical Provider, MD  metFORMIN (GLUCOPHAGE) 1000 MG tablet Take 1,000 mg by mouth 2 (two) times daily with a meal.   Yes Historical Provider, MD  pravastatin (PRAVACHOL) 20 MG tablet Take 20 mg by mouth at bedtime.   Yes Historical Provider, MD  tamsulosin (FLOMAX) 0.4 MG CAPS capsule Take 0.4 mg by mouth daily as needed (for bladder spasms).   Yes Historical Provider, MD  timolol (TIMOPTIC-XR) 0.5 % ophthalmic gel-forming Place 1 drop into both eyes at bedtime.   Yes Historical Provider, MD   Allergies  Allergen Reactions  . Actos [Pioglitazone] Anaphylaxis  . Avandia [Rosiglitazone] Shortness Of Breath  . Aspirin Other (See Comments)    Reaction: cramping and GI upset  . Celecoxib Other (See Comments)    Reaction: GI upset  . Penicillins Hives    FAMILY HISTORY:  Family History  Problem Relation Age of Onset  . Hypertension Mother    SOCIAL HISTORY:  reports that she has quit smoking. She does not have any smokeless tobacco history on file. She reports that she does not drink alcohol or use illicit drugs.  REVIEW OF SYSTEMS:  As per HPI and prior notes  SUBJECTIVE:   VITAL SIGNS: Temp:  [97.5  F (36.4 C)-98.4 F (36.9 C)] 98.4 F (36.9 C) (09/20 2015) Pulse Rate:  [60-121] 90 (09/20 2100) Resp:  [16-28] 24 (09/20 2100) BP: (86-115)/(31-58) 103/46 mmHg (09/20 2100) SpO2:  [84 %-100 %] 100 % (09/20 2100) Weight:  [77.8 kg (171 lb 8.3 oz)-79.5 kg (175 lb 4.3 oz)] 77.8 kg (171 lb 8.3 oz) (09/20 2015) HEMODYNAMICS:   VENTILATOR SETTINGS:   INTAKE / OUTPUT:  Intake/Output Summary (Last 24 hours) at 09/19/14 2319 Last data filed at 09/19/14 2000  Gross per 24 hour  Intake 1177.86 ml  Output   1605 ml  Net -427.14 ml    PHYSICAL EXAMINATION: General:  Pleasant, NAD, RASS 0 Neuro:  No focal deficits HEENT:  Normal Cardiovascular: IRIR, no M Lungs:  Bibasilar crackles, no wheezes Abdomen:  Soft, NT, +BS Ext: warm, no edema  LABS:  CBC  Recent Labs Lab Sep 20, 2014 0116 09/19/14 0545 09/19/14 2028  WBC 13.3* 7.9 8.6  HGB 11.1* 9.5* 9.7*  HCT 34.2* 30.7* 31.1*  PLT 250 241 223   Coag's No results found for this basename: APTT, INR,  in the last 168 hours BMET  Recent Labs Lab 20-Sep-2014 0116 09/19/14 0545  NA 131* 134*  K 5.3 4.7  CL 95* 98  CO2 22 25  BUN 35* 46*  CREATININE 0.90 1.04  GLUCOSE 212* 192*   Electrolytes  Recent Labs Lab Sep 20, 2014 0116 09/20/2014 1503 09/19/14 0545  CALCIUM 10.1  --  9.7  MG  --  1.3* 1.9   Sepsis Markers No results found for this basename: LATICACIDVEN, PROCALCITON, O2SATVEN,  in the last 168 hours ABG  Recent Labs Lab 09/19/14 1945  PHART 7.245*  PCO2ART 65.4*  PO2ART 80.0   Liver Enzymes No results found for this basename: AST, ALT, ALKPHOS, BILITOT, ALBUMIN,  in the last 168 hours Cardiac Enzymes  Recent Labs Lab 09-20-2014 0116 09/20/2014 0905 09/20/14 1503  TROPONINI <0.30 <0.30 <0.30  PROBNP 6065.0*  --   --    Glucose  Recent Labs Lab September 20, 2014 1724 2014-09-20 2127 09/19/14 0611 09/19/14 1203 09/19/14 1627 09/19/14 1842  GLUCAP 112* 143* 154* 89 230* 217*    CXR: mild interstitial edema  pattern  ASSESSMENT: Acute hypoxic resp failure Mild pulm edema Mild-mod PAH - likely post-capillary hypertension Abnormal ABG - does not correspond to clinical findings. Suspect venous or factitious  AFRVR - improved rate control on amiodarone gtt  Cards following  PLAN/RECS: Watch in ICU overnight TEE cardioversion planned 9/21 per Cards Diuresis as permitted by BP and renal function If no complications with cardioversion, can likely go back to telemetry and [facility] service 9/22  Billy Fischer, MD ; [facility] service [place] [phone].  After 5:30 PM or weekends, call [phone]

## 2014-09-19 NOTE — Treatment Plan (Signed)
Pt with increasing lethargy and o2 requirements noted today. Mixed venous gas with pH of 7.1 with pCO2 of 75. RT now following for BiPAP management, however, pt will not tolerate BiPAP. Discussed with Dr. Delford Field of critical care. Plan to move pt to ICU for closer monitoring, possible need for intubation.

## 2014-09-19 NOTE — Patient Care Conference (Signed)
Attempted to call pt's son, Rosanne Ashing, at both numbers listed for update. Unable to reach son. Will try again later.

## 2014-09-20 DIAGNOSIS — I509 Heart failure, unspecified: Secondary | ICD-10-CM

## 2014-09-20 DIAGNOSIS — I4891 Unspecified atrial fibrillation: Secondary | ICD-10-CM

## 2014-09-20 LAB — GLUCOSE, CAPILLARY
GLUCOSE-CAPILLARY: 188 mg/dL — AB (ref 70–99)
GLUCOSE-CAPILLARY: 223 mg/dL — AB (ref 70–99)
Glucose-Capillary: 235 mg/dL — ABNORMAL HIGH (ref 70–99)
Glucose-Capillary: 216 mg/dL — ABNORMAL HIGH (ref 70–99)
Glucose-Capillary: 192 mg/dL — ABNORMAL HIGH (ref 70–99)

## 2014-09-20 LAB — PROCALCITONIN: Procalcitonin: <0.1 ng/mL

## 2014-09-20 LAB — CBC
HCT: 28.8 % — ABNORMAL LOW (ref 36.0–46.0)
Hemoglobin: 8.5 g/dL — ABNORMAL LOW (ref 12.0–15.0)
MCH: 28.6 pg (ref 26.0–34.0)
MCHC: 29.5 g/dL — AB (ref 30.0–36.0)
MCV: 97 fL (ref 78.0–100.0)
Platelets: 205 10*3/uL (ref 150–400)
RBC: 2.97 MIL/uL — ABNORMAL LOW (ref 3.87–5.11)
RDW: 15 % (ref 11.5–15.5)
WBC: 7 10*3/uL (ref 4.0–10.5)

## 2014-09-20 LAB — HEPARIN LEVEL (UNFRACTIONATED): HEPARIN UNFRACTIONATED: 0.64 [IU]/mL (ref 0.30–0.70)

## 2014-09-20 MED ORDER — SODIUM CHLORIDE 0.9 % IV BOLUS (SEPSIS)
500.0000 mL | Freq: Once | INTRAVENOUS | Status: AC
  Administered 2014-09-20: 500 mL via INTRAVENOUS

## 2014-09-20 MED ORDER — IPRATROPIUM BROMIDE 0.02 % IN SOLN
0.5000 mg | Freq: Three times a day (TID) | RESPIRATORY_TRACT | Status: DC
  Administered 2014-09-20 – 2014-09-24 (×14): 0.5 mg via RESPIRATORY_TRACT
  Filled 2014-09-20 (×14): qty 2.5

## 2014-09-20 MED ORDER — APIXABAN 5 MG PO TABS
5.0000 mg | ORAL_TABLET | Freq: Two times a day (BID) | ORAL | Status: DC
  Administered 2014-09-20 (×2): 5 mg via ORAL
  Filled 2014-09-20 (×4): qty 1

## 2014-09-20 MED ORDER — LEVALBUTEROL HCL 0.63 MG/3ML IN NEBU
0.6300 mg | INHALATION_SOLUTION | Freq: Three times a day (TID) | RESPIRATORY_TRACT | Status: DC
  Administered 2014-09-20 – 2014-09-24 (×14): 0.63 mg via RESPIRATORY_TRACT
  Filled 2014-09-20 (×24): qty 3

## 2014-09-20 NOTE — Progress Notes (Signed)
CARDIOLOGY CONSULT NOTE   Patient ID: Natalie Hicks MRN: [id number], DOB/AGE: August 18, 1925   Admit date: 17-Oct-2014 Date of Consult: 09/20/2014  Primary Physician: Lupita Raider, MD Primary Cardiologist: new to  - seen by Katherina Right, MD  Pt. Profile  80 y/o female without prior cardiac hx, who presented to the ED on 9/18 with dyspnea.   She was found to have a-fib with RVR  Problem List  Past Medical History  Diagnosis Date  . Diabetes   . Chronic kidney disease   . Hypertension   . A-fib     a. Dx 08/2014, CHA2DS2VASc = 5;  b. 08/2014 Echo: EF 50-55%, mod MR, mod dil LA, mod-sev TR, PASP .Marland Kitchen    Past Surgical History  Procedure Laterality Date  . Tonsillectomy    . Hemrhoidectomy      Allergies  Allergies  Allergen Reactions  . Actos [Pioglitazone] Anaphylaxis  . Avandia [Rosiglitazone] Shortness Of Breath  . Aspirin Other (See Comments)    Reaction: cramping and GI upset  . Celecoxib Other (See Comments)    Reaction: GI upset  . Penicillins Hives     80 y/o female with the above problem list.  She does not have a prior cardiac hx.  She also has a h/o chronic cough and was treated w/ outpt abx in June.  At baseline, she lives by herself in assisted living and generally uses a walker to get around.  She has no h/o falls and tries to walk around her building 4x/day.  She has had worsening dyspnea with CO2 retention while up on 3E.  Was transferred to CCU.    Inpatient Medications  . B-complex with vitamin C  1 tablet Oral Daily  . calcium carbonate  1,250 mg Oral QPC lunch  . cholecalciferol  1,000 Units Oral QPC lunch  . ciprofloxacin  200 mg Intravenous Q12H  . feeding supplement (GLUCERNA SHAKE)  237 mL Oral BID BM  . furosemide  40 mg Intravenous BID  . insulin aspart  0-15 Units Subcutaneous TID WC  . insulin aspart  0-5 Units Subcutaneous QHS  . ipratropium  0.5 mg Nebulization TID  . levalbuterol  0.63 mg Nebulization TID  . metoprolol tartrate   50 mg Oral BID  . mometasone-formoterol  2 puff Inhalation BID  . multivitamin with minerals  1 tablet Oral Daily  . polyethylene glycol  17 g Oral Daily  . saccharomyces boulardii  250 mg Oral BID  . simvastatin  10 mg Oral q1800  . sodium chloride  3 mL Intravenous Q12H  . tamsulosin  0.4 mg Oral Daily  . timolol  1 drop Both Eyes QHS   Family History Family History  Problem Relation Age of Onset  . Hypertension Mother     Social History History   Social History  . Marital Status: Divorced    Spouse Name: N/A    Number of Children: N/A  . Years of Education: N/A   Occupational History  . Not on file.   Social History Main Topics  . Smoking status: Former Games developer  . Smokeless tobacco: Not on file  . Alcohol Use: No  . Drug Use: No  . Sexual Activity: Not on file   Other Topics Concern  . Not on file   Social History Narrative   Lives in Assisted Living.      Physical Exam  Blood pressure 102/50, pulse 91, temperature 97.3 F (36.3 C), temperature source Oral, resp. rate 12, height   (1.575 m), weight 174 lb 2.6 oz (79 kg), SpO2 92.00%.  General: Pleasant, NAD Psych: Normal affect. Neuro: Alert and oriented X 3. Moves all extremities spontaneously. HEENT: Normal  Neck: Supple without bruits or JVD. Lungs:  Rales on right base.  Upper airway congestion Heart: IR, IR, distant, no s3, s4, or murmurs. Abdomen: Soft, non-tender, non-distended, BS + x 4.  Extremities: No clubbing, cyanosis or edema. DP/PT/Radials 2+ and equal bilaterally.  Labs   Recent Labs  08/31/2014 0116 09/16/2014 0905 09/23/2014 1503  TROPONINI <0.30 <0.30 <0.30   Lab Results  Component Value Date   WBC 7.0 09/20/2014   HGB 8.5* 09/20/2014   HCT 28.8* 09/20/2014   MCV 97.0 09/20/2014   PLT 205 09/20/2014     Recent Labs Lab 09/19/14 0545  NA 134*  K 4.7  CL 98  CO2 25  BUN 46*  CREATININE 1.04  CALCIUM 9.7  GLUCOSE 192*   Radiology/Studies  Dg Chest 2 View  09/17/2014    CLINICAL DATA:  Cough, shortness of Breath  EXAM: CHEST  2 VIEW  COMPARISON:  06/16/2014  FINDINGS: Cardiomegaly is noted. No acute infiltrate or pulmonary edema. Stable chronic interstitial prominence. Osteopenia and degenerative changes thoracic spine. Prior vertebroplasty lumbar spine.  IMPRESSION: No infiltrate or pulmonary edema. Cardiomegaly. Chronic interstitial prominence.   Electronically Signed   By: Natasha Mead M.D.   On: 09/17/2014 16:25   2D Echocardiogram 9.19.2015  Study Conclusions  - Left ventricle: The cavity size was normal. There was mild focal   basal hypertrophy of the septum. Systolic function was normal.   The estimated ejection fraction was in the range of 50% to 55%.   Regional wall motion abnormalities cannot be excluded. - Ventricular septum: Septal motion showed paradox. - Mitral valve: Calcified annulus. Moderately thickened, mildly   calcified leaflets . There was moderate regurgitation. - Left atrium: The atrium was moderately dilated. - Right atrium: The atrium was mildly dilated. - Tricuspid valve: There was moderate-severe regurgitation. - Pulmonary arteries: PA peak pressure: 52 mm Hg (S). _____________   ECG  Afib, 90, lbbb  ASSESSMENT AND PLAN  1. Afib RVR:  She has not improved since admission and in fact, is feeling weaker.  She needs to improve from a pulmonary standpoint before we can consider moderate sedation and TEE / cardioversion.  She has a respiratory acidosis .   Will start Eliquis and DC heparin.  She should ideally be at a stable state on Eliquis before we do cardioversion.   Her rate with the atrial fib is not all that tachycardic and I do not think the AF is responsible for her pulmonary issues.   I think she likely has pneumonia.   2.  HTN:  Stable.  Follow on bb.  3.  CKD III:  Stable.  Creat < 1.5.

## 2014-09-20 NOTE — Progress Notes (Signed)
OT Cancellation Note  Patient DetailsRASHEL OKEEFEt L Hicks MRN: [id number] DOB: [birth year]   Cancelled Treatment:    Reason Eval/Treat Not Completed: Medical issues which prohibited therapy. Pt with decreased tolerance of bed level activity per PT note, decreased 02 sats and hypotension. Will continue to follow and initiate OT as appropriate.  Evern Bio 09/20/2014, 10:57 AM [phone]

## 2014-09-20 NOTE — Progress Notes (Signed)
PULMONARY / CRITICAL CARE MEDICINE   Name: Natalie Hicks MRN: [id number] DOB: May 25, 1925    ADMISSION DATE:  09/22/2014   INITIAL PRESENTATION:  56F admitted to [facility] service 9/19 with new onset AF with RVR and dyspnea due to CHF. Transferred to ICU/SDU 9/20 with increased SOB. Did not require vent support  STUDIES:  9/19 TTE: LVEF 50-55%. LA moderately dilated. RA mildly dilated. PASP est 52 mmHg  SIGNIFICANT EVENTS:    HISTORY OF PRESENT ILLNESS:   Admission history reviewed. Transferred to ICU/SDU due to lethargy and abnormal ABG with intention of implementing NPPV. However, on arrival, pt was fully alert and in no respiratory distress. She denies dyspnea and chest pain  PAST MEDICAL HISTORY :  Past Medical History  Diagnosis Date  . Diabetes   . Chronic kidney disease   . Hypertension   . A-fib     a. Dx 08/2014, CHA2DS2VASc = 5;  b. 08/2014 Echo: EF 50-55%, mod MR, mod dil LA, mod-sev TR, PASP .Marland Kitchen   Past Surgical History  Procedure Laterality Date  . Tonsillectomy    . Hemrhoidectomy     Prior to Admission medications   Medication Sig Start Date End Date Taking? Authorizing Provider  acetaminophen (TYLENOL) 325 MG tablet Take 650 mg by mouth every 6 (six) hours as needed for mild pain.   Yes Historical Provider, MD  atenolol-chlorthalidone (TENORETIC) 50-25 MG per tablet Take 1 tablet by mouth at bedtime.   Yes Historical Provider, MD  B Complex-C (B-COMPLEX WITH VITAMIN C) tablet Take 1 tablet by mouth daily.   Yes Historical Provider, MD  calcium carbonate (OS-CAL) 600 MG TABS Take 600 mg by mouth daily after lunch.   Yes Historical Provider, MD  cetirizine (ZYRTEC) 10 MG tablet Take 10 mg by mouth daily as needed for allergies or rhinitis.   Yes Historical Provider, MD  cholecalciferol (VITAMIN D) 1000 UNITS tablet Take 1,000 Units by mouth daily after lunch.   Yes Historical Provider, MD  docusate sodium (COLACE) 100 MG capsule Take 100 mg by mouth 2 (two)  times daily as needed for mild constipation.   Yes Historical Provider, MD  Fluticasone-Salmeterol (ADVAIR) 250-50 MCG/DOSE AEPB Inhale 1 puff into the lungs 2 (two) times daily.   Yes Historical Provider, MD  glyBURIDE (DIABETA) 2.5 MG tablet Take 2.5 mg by mouth daily.   Yes Historical Provider, MD  guaiFENesin (MUCINEX) 600 MG 12 hr tablet Take 1,200 mg by mouth 2 (two) times daily as needed (for congestion).   Yes Historical Provider, MD  guaiFENesin (ROBITUSSIN) 100 MG/5ML SOLN Take 5 mLs by mouth every 6 (six) hours as needed for cough or to loosen phlegm.   Yes Historical Provider, MD  loperamide (IMODIUM) 2 MG capsule Take 2 mg by mouth as needed for diarrhea or loose stools.   Yes Historical Provider, MD  losartan (COZAAR) 100 MG tablet Take 100 mg by mouth every morning.   Yes Historical Provider, MD  metFORMIN (GLUCOPHAGE) 1000 MG tablet Take 1,000 mg by mouth 2 (two) times daily with a meal.   Yes Historical Provider, MD  pravastatin (PRAVACHOL) 20 MG tablet Take 20 mg by mouth at bedtime.   Yes Historical Provider, MD  tamsulosin (FLOMAX) 0.4 MG CAPS capsule Take 0.4 mg by mouth daily as needed (for bladder spasms).   Yes Historical Provider, MD  timolol (TIMOPTIC-XR) 0.5 % ophthalmic gel-forming Place 1 drop into both eyes at bedtime.   Yes Historical Provider, MD   Allergies  Allergen Reactions  . Actos [Pioglitazone] Anaphylaxis  . Avandia [Rosiglitazone] Shortness Of Breath  . Aspirin Other (See Comments)    Reaction: cramping and GI upset  . Celecoxib Other (See Comments)    Reaction: GI upset  . Penicillins Hives    FAMILY HISTORY:  Family History  Problem Relation Age of Onset  . Hypertension Mother    SOCIAL HISTORY:  reports that she has quit smoking. She does not have any smokeless tobacco history on file. She reports that she does not drink alcohol or use illicit drugs.  REVIEW OF SYSTEMS:  As per HPI and prior notes  SUBJECTIVE:   VITAL SIGNS: Temp:  [97.3  F (36.3 C)-98.4 F (36.9 C)] 97.3 F (36.3 C) (09/21 0730) Pulse Rate:  [44-104] 52 (09/21 0900) Resp:  [12-28] 13 (09/21 0900) BP: (85-130)/(26-61) 88/40 mmHg (09/21 1008) SpO2:  [84 %-100 %] 100 % (09/21 1409) Weight:  [171 lb 8.3 oz (77.8 kg)-174 lb 2.6 oz (79 kg)] 174 lb 2.6 oz (79 kg) (09/21 0400) HEMODYNAMICS:   VENTILATOR SETTINGS:   INTAKE / OUTPUT:  Intake/Output Summary (Last 24 hours) at 09/20/14 1411 Last data filed at 09/20/14 1000  Gross per 24 hour  Intake 1058.38 ml  Output    700 ml  Net 358.38 ml    PHYSICAL EXAMINATION: General:  Pleasant, NAD, RASS 0 Neuro:  No focal deficits HEENT:  Normal Cardiovascular: IRIR, no M Lungs:  Bibasilar crackles, no wheezes Abdomen:  Soft, NT, +BS Ext: warm, no edema  LABS:  CBC  Recent Labs Lab 09/19/14 0545 09/19/14 2028 09/20/14 0254  WBC 7.9 8.6 7.0  HGB 9.5* 9.7* 8.5*  HCT 30.7* 31.1* 28.8*  PLT 241 223 205   Coag's No results found for this basename: APTT, INR,  in the last 168 hours BMET  Recent Labs Lab 09/27/2014 0116 09/19/14 0545  NA 131* 134*  K 5.3 4.7  CL 95* 98  CO2 22 25  BUN 35* 46*  CREATININE 0.90 1.04  GLUCOSE 212* 192*   Electrolytes  Recent Labs Lab 09/08/2014 0116 09/19/2014 1503 09/19/14 0545  CALCIUM 10.1  --  9.7  MG  --  1.3* 1.9   Sepsis Markers No results found for this basename: LATICACIDVEN, PROCALCITON, O2SATVEN,  in the last 168 hours ABG  Recent Labs Lab 09/19/14 1945  PHART 7.245*  PCO2ART 65.4*  PO2ART 80.0   Liver Enzymes No results found for this basename: AST, ALT, ALKPHOS, BILITOT, ALBUMIN,  in the last 168 hours Cardiac Enzymes  Recent Labs Lab 09/20/2014 0116 09/29/2014 0905 09/29/2014 1503  TROPONINI <0.30 <0.30 <0.30  PROBNP 6065.0*  --   --    Glucose  Recent Labs Lab 09/19/14 1203 09/19/14 1627 09/19/14 1842 09/20/14 0003 09/20/14 0748 09/20/14 1301  GLUCAP 89 230* 217* 188* 223* 235*    CXR: mild interstitial edema  pattern  ASSESSMENT: Acute hypoxic resp failure Mild pulm edema Mild-mod PAH - likely post-capillary hypertension Abnormal ABG  AFRVR - improved rate control on amiodarone gtt  Cards following Anemia  PLAN/RECS: Continue to monitor in the ICU Incentive spirometry Respiratory acidosis noted on ABG - plan for NPPV Review of CXR does not show any infiltrate or consolidation to suggest a pneumonia.  She does have a very small right pleural effusion, which is not causing her respiratory issues.  TEE cardioversion per Cards once her respiratory status is optimized Diuresis as permitted by BP and renal function   Critical Care time - 35  mins  Stephanie Acre, MD [facility] Pager [phone] On Call Pager [phone]

## 2014-09-20 NOTE — Progress Notes (Signed)
ANTICOAGULATION CONSULT NOTE - Follow Up Consult  Pharmacy Consult for apixaban Indication: atrial fibrillation  Allergies  Allergen Reactions  . Actos [Pioglitazone] Anaphylaxis  . Avandia [Rosiglitazone] Shortness Of Breath  . Aspirin Other (See Comments)    Reaction: cramping and GI upset  . Celecoxib Other (See Comments)    Reaction: GI upset  . Penicillins Hives    Patient Measurements: Height:  (157.5 cm) Weight: 174 lb 2.6 oz (79 kg) IBW/kg (Calculated) : 50.1  Vital Signs: Temp: 97.3 F (36.3 C) (09/21 0730) Temp src: Oral (09/21 0730) BP: 88/40 mmHg (09/21 1008) Pulse Rate: 52 (09/21 0900)  Labs:  Recent Labs  09/07/2014 0116 09/09/2014 0905  09/09/2014 1503 09/07/2014 2245 09/19/14 0545 09/19/14 2028 09/20/14 0254  HGB 11.1*  --   --   --   --  9.5* 9.7* 8.5*  HCT 34.2*  --   --   --   --  30.7* 31.1* 28.8*  PLT 250  --   --   --   --  241 223 205  HEPARINUNFRC  --   --   < > 0.39 0.60 0.47  --  0.64  CREATININE 0.90  --   --   --   --  1.04  --   --   TROPONINI <0.30 <0.30  --  <0.30  --   --   --   --   < > = values in this interval not displayed.  Estimated Creatinine Clearance: 37.1 ml/min (by C-G formula based on Cr of 1.04).   Medications:  Scheduled:  . apixaban  5 mg Oral BID  . B-complex with vitamin C  1 tablet Oral Daily  . calcium carbonate  1,250 mg Oral QPC lunch  . cholecalciferol  1,000 Units Oral QPC lunch  . ciprofloxacin  200 mg Intravenous Q12H  . feeding supplement (GLUCERNA SHAKE)  237 mL Oral BID BM  . furosemide  40 mg Intravenous BID  . insulin aspart  0-15 Units Subcutaneous TID WC  . insulin aspart  0-5 Units Subcutaneous QHS  . ipratropium  0.5 mg Nebulization TID  . levalbuterol  0.63 mg Nebulization TID  . metoprolol tartrate  50 mg Oral BID  . mometasone-formoterol  2 puff Inhalation BID  . multivitamin with minerals  1 tablet Oral Daily  . polyethylene glycol  17 g Oral Daily  . saccharomyces boulardii  250 mg  Oral BID  . simvastatin  10 mg Oral q1800  . sodium chloride  3 mL Intravenous Q12H  . tamsulosin  0.4 mg Oral Daily  . timolol  1 drop Both Eyes QHS    Assessment: 80 yo f admitted on 9/19 for SOB.  Patient with new onset afib.  Pharmacy is consulted to discontinue heparin and transition to apixaban.  Patient's SCr is 1.04, weight is 79 kg and age is 89.  Will begin apixaban 5 mg PO BID.  Dose is unlikely to change, so pharmacy will sign off and follow peripherally.  Goal of Therapy: Monitor platelets by anticoagulation protocol: Yes   Plan:  Apixaban 5 mg PO BID Monitor CBC, weight, s/s of bleeding, clinical course  Thank you for allowing pharmacy to be a part of this patient's care.  Kazuko Clemence L. Roseanne Reno, PharmD Clinical Pharmacy Resident Pager: [phone] 09/20/2014 12:41 PM

## 2014-09-20 NOTE — Progress Notes (Addendum)
Physical Therapy Treatment Patient Details Name: Natalie Hicks MRN: [id number] DOB: June 19, 1925 Today's Date: 09/20/2014    History of Present Illness Natalie Hicks is a 80 y.o. Caucasian female with history of hypertension, chronic kidney disease stage III, diabetes, urinary incontinence, and chronic back pain (h/o compression fractures with vertebroplasty). Pt adm with incr SOB (x several days) and found to be in atrial fibrillation with rapid ventricular rate    PT Comments    Pt now in stepdown unit. Incr shortness of breath and hypotensive on arrival. Initiated bed level exercises and pt requiring frequent rest breaks to maintain SaO2>90% (lowest 88% with UE exercise). BP dropped with resisted LE exercises (to MAP of 39; increased to MAP of 53 with rest). Further activity deferred and discussed with RN. He requested assistance to get pt up to [facility] and recommended use of bedpan due to hypotension and incr fall risk at this time.    Follow Up Recommendations  SNF;Supervision/Assistance - 24 hour (will continue to monitor; pt from ALF and hopes to return there)     Equipment Recommendations  None recommended by PT    Recommendations for Other Services OT consult     Precautions / Restrictions Precautions Precautions: Fall Precaution Comments: pt denies falls at ALF but is very weak now     Mobility  Bed Mobility               General bed mobility comments: not tested due to decr SaO2 and BP with ROM and resisted bed exercises  Transfers                    Ambulation/Gait                 Stairs            Wheelchair Mobility    Modified Rankin (Stroke Patients Only)       Balance                                    Cognition Arousal/Alertness: Lethargic Behavior During Therapy: WFL for tasks assessed/performed Overall Cognitive Status: Within Functional Limits for tasks assessed                       Exercises General Exercises - Upper Extremity Shoulder Flexion: AAROM;Left;5 reps General Exercises - Lower Extremity Ankle Circles/Pumps: AROM;Both;15 reps Quad Sets: AROM;Both;10 reps Heel Slides: AROM;Strengthening;Both;5 reps (resisted extension)    General Comments General comments (skin integrity, edema, etc.): Pt required multiple rest periods throughout exercises due to decr SaO2. Further exercises deferred due to pt's dyspnea and MAP<60 (decr with activity)      Pertinent Vitals/Pain HR 74-84 BP 102/50 supine        85/26 supine with exercises        88/40 supine at rest (RN notified and in to assess pt)  Pain Assessment: No/denies pain    Home Living                      Prior Function            PT Goals (current goals can now be found in the care plan section) Acute Rehab PT Goals Patient Stated Goal: return to ALF Progress towards PT goals: Not progressing toward goals - comment (decline in cardiopulmonary status)    Frequency  Min 3X/week  PT Plan Discharge plan needs to be updated    Co-evaluation             End of Session Equipment Utilized During Treatment: Oxygen Activity Tolerance: Patient limited by fatigue;Treatment limited secondary to medical complications (Comment) (hypotension) Patient left: in bed;with call bell/phone within reach;with nursing/sitter in room     Time: 1610-9604 PT Time Calculation (min): 24 min  Charges:  $Therapeutic Exercise: 23-37 mins                    G Codes:      Hank Walling 10/10/14, 10:23 AM Pager [phone]

## 2014-09-21 ENCOUNTER — Inpatient Hospital Stay (HOSPITAL_COMMUNITY): Payer: Medicare Other

## 2014-09-21 DIAGNOSIS — J9600 Acute respiratory failure, unspecified whether with hypoxia or hypercapnia: Secondary | ICD-10-CM

## 2014-09-21 DIAGNOSIS — I5031 Acute diastolic (congestive) heart failure: Principal | ICD-10-CM

## 2014-09-21 LAB — BLOOD GAS, ARTERIAL
ACID-BASE DEFICIT: 0.2 mmol/L (ref 0.0–2.0)
Bicarbonate: 24.6 mEq/L — ABNORMAL HIGH (ref 20.0–24.0)
DRAWN BY: 23588
FIO2: 0.4 %
LHR: 16 {breaths}/min
O2 SAT: 96.9 %
PEEP/CPAP: 5 cmH2O
PO2 ART: 96.7 mmHg (ref 80.0–100.0)
Patient temperature: 98.6
TCO2: 26 mmol/L (ref 0–100)
VT: 420 mL
pCO2 arterial: 45.4 mmHg — ABNORMAL HIGH (ref 35.0–45.0)
pH, Arterial: 7.354 (ref 7.350–7.450)

## 2014-09-21 LAB — URINE MICROSCOPIC-ADD ON

## 2014-09-21 LAB — URINALYSIS, ROUTINE W REFLEX MICROSCOPIC
Bilirubin Urine: NEGATIVE
Glucose, UA: NEGATIVE mg/dL
Ketones, ur: NEGATIVE mg/dL
NITRITE: NEGATIVE
PROTEIN: NEGATIVE mg/dL
Specific Gravity, Urine: 1.014 (ref 1.005–1.030)
UROBILINOGEN UA: 0.2 mg/dL (ref 0.0–1.0)
pH: 5 (ref 5.0–8.0)

## 2014-09-21 LAB — BASIC METABOLIC PANEL
ANION GAP: 11 (ref 5–15)
BUN: 61 mg/dL — ABNORMAL HIGH (ref 6–23)
CALCIUM: 9.1 mg/dL (ref 8.4–10.5)
CHLORIDE: 91 meq/L — AB (ref 96–112)
CO2: 28 meq/L (ref 19–32)
Creatinine, Ser: 1.74 mg/dL — ABNORMAL HIGH (ref 0.50–1.10)
GFR calc Af Amer: 29 mL/min — ABNORMAL LOW (ref >90)
GFR calc non Af Amer: 25 mL/min — ABNORMAL LOW (ref >90)
Glucose, Bld: 175 mg/dL — ABNORMAL HIGH (ref 70–99)
Potassium: 4.9 mEq/L (ref 3.7–5.3)
SODIUM: 130 meq/L — AB (ref 137–147)

## 2014-09-21 LAB — POCT I-STAT 3, ART BLOOD GAS (G3+)
Acid-Base Excess: 2 mmol/L (ref 0.0–2.0)
Acid-base deficit: 2 mmol/L (ref 0.0–2.0)
BICARBONATE: 27.9 meq/L — AB (ref 20.0–24.0)
Bicarbonate: 28 mEq/L — ABNORMAL HIGH (ref 20.0–24.0)
O2 SAT: 99 %
O2 Saturation: 88 %
PCO2 ART: 71.2 mmHg — AB (ref 35.0–45.0)
PH ART: 7.201 — AB (ref 7.350–7.450)
PH ART: 7.351 (ref 7.350–7.450)
PO2 ART: 70 mmHg — AB (ref 80.0–100.0)
PO2 ART: 173 mmHg — AB (ref 80.0–100.0)
Patient temperature: 98.6
TCO2: 30 mmol/L (ref 0–100)
TCO2: 30 mmol/L (ref 0–100)
pCO2 arterial: 50.5 mmHg — ABNORMAL HIGH (ref 35.0–45.0)

## 2014-09-21 LAB — GLUCOSE, CAPILLARY
GLUCOSE-CAPILLARY: 241 mg/dL — AB (ref 70–99)
Glucose-Capillary: 296 mg/dL — ABNORMAL HIGH (ref 70–99)
Glucose-Capillary: 129 mg/dL — ABNORMAL HIGH (ref 70–99)
Glucose-Capillary: 109 mg/dL — ABNORMAL HIGH (ref 70–99)
Glucose-Capillary: 106 mg/dL — ABNORMAL HIGH (ref 70–99)

## 2014-09-21 LAB — CBC
HCT: 31.4 % — ABNORMAL LOW (ref 36.0–46.0)
Hemoglobin: 9.9 g/dL — ABNORMAL LOW (ref 12.0–15.0)
MCH: 28.8 pg (ref 26.0–34.0)
MCHC: 31.5 g/dL (ref 30.0–36.0)
MCV: 91.3 fL (ref 78.0–100.0)
Platelets: 193 10*3/uL (ref 150–400)
RBC: 3.44 MIL/uL — ABNORMAL LOW (ref 3.87–5.11)
RDW: 14.6 % (ref 11.5–15.5)
WBC: 10.2 10*3/uL (ref 4.0–10.5)

## 2014-09-21 LAB — PROCALCITONIN: Procalcitonin: <0.1 ng/mL

## 2014-09-21 MED ORDER — FENTANYL CITRATE 0.05 MG/ML IJ SOLN
200.0000 ug | Freq: Once | INTRAMUSCULAR | Status: AC
  Administered 2014-09-21: 200 ug via INTRAVENOUS

## 2014-09-21 MED ORDER — FENTANYL CITRATE 0.05 MG/ML IJ SOLN
INTRAMUSCULAR | Status: AC
  Filled 2014-09-21: qty 2

## 2014-09-21 MED ORDER — APIXABAN 5 MG PO TABS
5.0000 mg | ORAL_TABLET | Freq: Two times a day (BID) | ORAL | Status: DC
  Administered 2014-09-21: 5 mg
  Filled 2014-09-21 (×3): qty 1

## 2014-09-21 MED ORDER — HEPARIN SODIUM (PORCINE) 5000 UNIT/ML IJ SOLN
5000.0000 [IU] | Freq: Three times a day (TID) | INTRAMUSCULAR | Status: DC
  Administered 2014-09-21: 5000 [IU] via SUBCUTANEOUS
  Filled 2014-09-21: qty 1

## 2014-09-21 MED ORDER — MIDAZOLAM HCL 2 MG/2ML IJ SOLN
INTRAMUSCULAR | Status: AC
  Filled 2014-09-21: qty 2

## 2014-09-21 MED ORDER — LIDOCAINE HCL (CARDIAC) 20 MG/ML IV SOLN
INTRAVENOUS | Status: AC
  Filled 2014-09-21: qty 5

## 2014-09-21 MED ORDER — FENTANYL CITRATE 0.05 MG/ML IJ SOLN: 50.0000 ug | Freq: Once | INTRAMUSCULAR | Status: AC

## 2014-09-21 MED ORDER — ETOMIDATE 2 MG/ML IV SOLN
0.3000 mg/kg | Freq: Once | INTRAVENOUS | Status: AC
  Administered 2014-09-21: 20 mg via INTRAVENOUS
  Filled 2014-09-21: qty 12.23

## 2014-09-21 MED ORDER — FAMOTIDINE IN NACL 20-0.9 MG/50ML-% IV SOLN
20.0000 mg | Freq: Two times a day (BID) | INTRAVENOUS | Status: DC
  Administered 2014-09-21 – 2014-09-22 (×2): 20 mg via INTRAVENOUS
  Filled 2014-09-21 (×3): qty 50

## 2014-09-21 MED ORDER — CHLORHEXIDINE GLUCONATE 0.12 % MT SOLN
15.0000 mL | Freq: Two times a day (BID) | OROMUCOSAL | Status: DC
  Administered 2014-09-21 – 2014-09-24 (×8): 15 mL via OROMUCOSAL
  Filled 2014-09-21 (×8): qty 15

## 2014-09-21 MED ORDER — INSULIN ASPART 100 UNIT/ML ~~LOC~~ SOLN
2.0000 [IU] | SUBCUTANEOUS | Status: DC
  Administered 2014-09-21: 6 [IU] via SUBCUTANEOUS
  Administered 2014-09-21 – 2014-09-24 (×7): 2 [IU] via SUBCUTANEOUS
  Administered 2014-09-24: 4 [IU] via SUBCUTANEOUS

## 2014-09-21 MED ORDER — METOPROLOL TARTRATE 25 MG/10 ML ORAL SUSPENSION
50.0000 mg | Freq: Two times a day (BID) | ORAL | Status: DC
  Filled 2014-09-21 (×3): qty 20

## 2014-09-21 MED ORDER — PHENYLEPHRINE HCL 10 MG/ML IJ SOLN
0.0000 ug/min | INTRAVENOUS | Status: DC
  Administered 2014-09-21: 30 ug/min via INTRAVENOUS
  Filled 2014-09-21 (×2): qty 4

## 2014-09-21 MED ORDER — ROCURONIUM BROMIDE 50 MG/5ML IV SOLN
INTRAVENOUS | Status: AC
  Filled 2014-09-21: qty 2

## 2014-09-21 MED ORDER — ADULT MULTIVITAMIN LIQUID CH
5.0000 mL | Freq: Every day | ORAL | Status: DC
  Administered 2014-09-21 – 2014-09-24 (×4): 5 mL via ORAL
  Filled 2014-09-21 (×4): qty 5

## 2014-09-21 MED ORDER — SODIUM CHLORIDE 0.9 % IV SOLN
0.0000 ug/h | INTRAVENOUS | Status: DC
  Administered 2014-09-21 – 2014-09-23 (×2): 50 ug/h via INTRAVENOUS
  Filled 2014-09-21 (×2): qty 50

## 2014-09-21 MED ORDER — SUCCINYLCHOLINE CHLORIDE 20 MG/ML IJ SOLN
INTRAMUSCULAR | Status: AC
  Filled 2014-09-21: qty 1

## 2014-09-21 MED ORDER — CETYLPYRIDINIUM CHLORIDE 0.05 % MT LIQD
7.0000 mL | Freq: Four times a day (QID) | OROMUCOSAL | Status: DC
  Administered 2014-09-21 – 2014-09-24 (×14): 7 mL via OROMUCOSAL

## 2014-09-21 MED ORDER — ETOMIDATE 2 MG/ML IV SOLN
INTRAVENOUS | Status: AC
  Filled 2014-09-21: qty 20

## 2014-09-21 MED ORDER — FENTANYL BOLUS VIA INFUSION
25.0000 ug | INTRAVENOUS | Status: DC | PRN
  Administered 2014-09-22: 50 ug via INTRAVENOUS
  Filled 2014-09-21: qty 50

## 2014-09-21 MED ORDER — MIDAZOLAM HCL 2 MG/2ML IJ SOLN
4.0000 mg | Freq: Once | INTRAMUSCULAR | Status: AC
  Administered 2014-09-21: 4 mg via INTRAVENOUS

## 2014-09-21 NOTE — Progress Notes (Signed)
Upon entering the room for morning assessment Natalie Hicks was lethargic and complaining of SOB.  MD notified, orders received and MD at bedside for further evaluation of her respiratory status. Will continue to monitor closely.

## 2014-09-21 NOTE — Procedures (Signed)
Present for procedure  Ronit Cranfield, MD [place] [facility] Pager - [phone] On Call Pager - [phone]  

## 2014-09-21 NOTE — Progress Notes (Signed)
Pt. Refused to wear CPAP tonight.

## 2014-09-21 NOTE — Progress Notes (Addendum)
Note error

## 2014-09-21 NOTE — Progress Notes (Signed)
eLink Physician-Brief Progress Note Patient Name: JENET DURIO DOB: [birth year] MRN: [id number]   Date of Service  09/21/2014  HPI/Events of Note  Hypotension despite CVP 18 Remains in AF  eICU Interventions  Phenylephrine to maintain MAP > 65 mmHg     Intervention Category Major Interventions: Hypotension - evaluation and management  Billy Fischer 09/21/2014, 7:11 PM

## 2014-09-21 NOTE — Procedures (Signed)
Present for procedure  Natalie Lehenbauer, MD [place] Pager - [phone] On Call Pager - [phone]  

## 2014-09-21 NOTE — Clinical Documentation Improvement (Addendum)
PLEASE SPECIFY TYPE AND ACUITY OF CHF: Possible Clinical Conditions?  Chronic Systolic Congestive Heart Failure Chronic Diastolic Congestive Heart Failure Chronic Systolic & Diastolic Congestive Heart Failure Acute Systolic Congestive Heart Failure Acute Diastolic Congestive Heart Failure Acute Systolic & Diastolic Congestive Heart Failure Acute on Chronic Systolic Congestive Heart Failure Acute on Chronic Diastolic Congestive Heart Failure Acute on Chronic Systolic & Diastolic Congestive Heart Failure Other Condition Cannot Clinically Determine  Supporting Information:(As per notes) "-Likely due to atrial fibrillation with rapid ventricular rate and subsequent congestive heart failure"  -Cont on Lasix 40 mg IV twice daily    Thank You, Nevin Bloodgood, RN, BSN, CCDS,Clinical Documentation Specialist:  [phone]  [phone]=Cell - Health Information Management  Addendum: Review of ECHO and clinical status, most likely acute diastolic CHF.  Stephanie Acre, MD [facility] Pager [phone] On Call Pager [phone]

## 2014-09-21 NOTE — Procedures (Signed)
Arterial Catheter Insertion Procedure Note SHIMIKA AMES [id number] June 21, 1925  Procedure: Insertion of Arterial Catheter  Indications: Blood pressure monitoring and Frequent blood sampling  Procedure Details Consent: Unable to obtain consent because of altered level of consciousness. Time Out: Verified patient identification, verified procedure, site/side was marked, verified correct patient position, special equipment/implants available, medications/allergies/relevent history reviewed, required imaging and test results available.  Performed  Maximum sterile technique was used including antiseptics, cap, gloves, gown, hand hygiene and mask. Skin prep: Chlorhexidine; local anesthetic administered 20 gauge catheter was inserted into left radial artery using the Seldinger technique.  Evaluation Blood flow good; BP tracing good. Complications: No apparent complications.   Rutherford Guys, PA - C [facility] Pgr: [phone]  or [phone]

## 2014-09-21 NOTE — Procedures (Signed)
Intubation Procedure Note Natalie Hicks [id number] [birth year]  Procedure: Intubation Indications: Respiratory insufficiency  Procedure Details Consent: Risks of procedure as well as the alternatives and risks of each were explained to the (patient/caregiver).  Consent for procedure obtained.  Emergent consent via phone with son.    Time Out: Verified patient identification, verified procedure, site/side was marked, verified correct patient position, special equipment/implants available, medications/allergies/relevent history reviewed, required imaging and test results available.  Performed  Maximum sterile technique was used including antiseptics, cap, gloves, hand hygiene and mask.  MAC and 3   Evaluation Hemodynamic Status: BP stable throughout; O2 sats: stable throughout Patient's Current Condition: stable Complications: No apparent complications Patient did tolerate procedure well. Chest X-ray ordered to verify placement.  CXR: pending.   Procedure performed under direct supervision of Dr. Saralyn Pilar, NP-C [facility] Pgr: [phone] or [phone]     09/21/2014

## 2014-09-21 NOTE — Progress Notes (Addendum)
eLink Physician-Brief Progress Note Patient Name: Natalie Hicks DOB: 05-19-1925 MRN: [id number]   Date of Service  09/21/2014  HPI/Events of Note  AFRVR. Plan for DCCV soon Unable to get Xarelto per tube (cannot be crushed)  eICU Interventions  Changed SQ heparin to full dose IV UFH per pharm     Intervention Category Major Interventions: Arrhythmia - evaluation and management  Billy Fischer 09/21/2014, 4:58 PM   ADD: Ilda Basset prefers to continue with Eliquis. Will resume order for this  Billy Fischer, MD ; [facility] [phone].  After 5:30 PM or weekends, call [phone]

## 2014-09-21 NOTE — Progress Notes (Signed)
CARDIOLOGY CONSULT NOTE   Patient ID: BRANDEN VINE MRN: [id number], DOB/AGE: 1925-05-31   Admit date: 09/17/2014 Date of Consult: 09/21/2014  Primary Physician: Lupita Raider, MD Primary Cardiologist: new to  - seen by Katherina Right, MD  Pt. Profile  80 y/o female without prior cardiac hx, who presented to the ED on 9/18 with dyspnea.   She was found to have a-fib with RVR.  She was transferred to the CCU several days ago for increased respiratory distress - had a respiratory acidosis.   Very lethargic.   Problem List  Past Medical History  Diagnosis Date  . Diabetes   . Chronic kidney disease   . Hypertension   . A-fib     a. Dx 08/2014, CHA2DS2VASc = 5;  b. 08/2014 Echo: EF 50-55%, mod MR, mod dil LA, mod-sev TR, PASP .Marland Kitchen    Past Surgical History  Procedure Laterality Date  . Tonsillectomy    . Hemrhoidectomy      Allergies  Allergies  Allergen Reactions  . Actos [Pioglitazone] Anaphylaxis  . Avandia [Rosiglitazone] Shortness Of Breath  . Aspirin Other (See Comments)    Reaction: cramping and GI upset  . Celecoxib Other (See Comments)    Reaction: GI upset  . Penicillins Hives     80 y/o female with the above problem list.  She does not have a prior cardiac hx.  She also has a h/o chronic cough and was treated w/ outpt abx in June.  At baseline, she lives by herself in assisted living and generally uses a walker to get around.  She has no h/o falls and tries to walk around her building 4x/day.  She has had worsening dyspnea with CO2 retention while up on 3E.  Was transferred to CCU.    Inpatient Medications  . apixaban  5 mg Oral BID  . B-complex with vitamin C  1 tablet Oral Daily  . calcium carbonate  1,250 mg Oral QPC lunch  . cholecalciferol  1,000 Units Oral QPC lunch  . ciprofloxacin  200 mg Intravenous Q12H  . feeding supplement (GLUCERNA SHAKE)  237 mL Oral BID BM  . furosemide  40 mg Intravenous BID  . insulin aspart  0-15 Units  Subcutaneous TID WC  . insulin aspart  0-5 Units Subcutaneous QHS  . ipratropium  0.5 mg Nebulization TID  . levalbuterol  0.63 mg Nebulization TID  . metoprolol tartrate  50 mg Oral BID  . mometasone-formoterol  2 puff Inhalation BID  . multivitamin with minerals  1 tablet Oral Daily  . polyethylene glycol  17 g Oral Daily  . saccharomyces boulardii  250 mg Oral BID  . simvastatin  10 mg Oral q1800  . sodium chloride  3 mL Intravenous Q12H  . tamsulosin  0.4 mg Oral Daily  . timolol  1 drop Both Eyes QHS   Family History Family History  Problem Relation Age of Onset  . Hypertension Mother     Social History History   Social History  . Marital Status: Divorced    Spouse Name: N/A    Number of Children: N/A  . Years of Education: N/A   Occupational History  . Not on file.   Social History Main Topics  . Smoking status: Former Games developer  . Smokeless tobacco: Not on file  . Alcohol Use: No  . Drug Use: No  . Sexual Activity: Not on file   Other Topics Concern  . Not on file   Social History  Narrative   Lives in Assisted Living.      Physical Exam  Blood pressure 112/86, pulse 89, temperature 98.3 F (36.8 C), temperature source Oral, resp. rate 23, height  (1.575 m), weight 179 lb 10.8 oz (81.5 kg), SpO2 99.00%.  General: very lethergic this am .  Difficult to arouse Psych: lethargic today Neuro:   HEENT: Normal  Neck: Supple without bruits or JVD. Lungs:  Rales on right base.  Upper airway congestion Heart: IR, IR, distant, no s3, s4, or murmurs. Abdomen: Soft, non-tender, non-distended, BS + x 4.  Extremities: trace edema. DP/PT/Radials 2+ and equal bilaterally.  Labs   Recent Labs  09/03/2014 0905 09/05/2014 1503  TROPONINI <0.30 <0.30   Lab Results  Component Value Date   WBC 10.2 09/21/2014   HGB 9.9* 09/21/2014   HCT 31.4* 09/21/2014   MCV 91.3 09/21/2014   PLT 193 09/21/2014     Recent Labs Lab 09/19/14 0545  NA 134*  K 4.7  CL 98  CO2  25  BUN 46*  CREATININE 1.04  CALCIUM 9.7  GLUCOSE 192*   Radiology/Studies  Dg Chest 2 View  09/17/2014   CLINICAL DATA:  Cough, shortness of Breath  EXAM: CHEST  2 VIEW  COMPARISON:  06/16/2014  FINDINGS: Cardiomegaly is noted. No acute infiltrate or pulmonary edema. Stable chronic interstitial prominence. Osteopenia and degenerative changes thoracic spine. Prior vertebroplasty lumbar spine.  IMPRESSION: No infiltrate or pulmonary edema. Cardiomegaly. Chronic interstitial prominence.   Electronically Signed   By: Natasha Mead M.D.   On: 09/17/2014 16:25   2D Echocardiogram 9.19.2015  Study Conclusions  - Left ventricle: The cavity size was normal. There was mild focal   basal hypertrophy of the septum. Systolic function was normal.   The estimated ejection fraction was in the range of 50% to 55%.   Regional wall motion abnormalities cannot be excluded. - Ventricular septum: Septal motion showed paradox. - Mitral valve: Calcified annulus. Moderately thickened, mildly   calcified leaflets . There was moderate regurgitation. - Left atrium: The atrium was moderately dilated. - Right atrium: The atrium was mildly dilated. - Tricuspid valve: There was moderate-severe regurgitation. - Pulmonary arteries: PA peak pressure: 52 mm Hg (S). _____________   ECG  Afib, 90, lbbb  ASSESSMENT AND PLAN  1. Afib RVR:  Her rate is ok  Still a bit tachy She has a respiratory acidosis .   She is on Eliquis. Her rate is fairly well controlled.  I do not think that she needs an urgent cardioversion .  Can consider CV as her pulmonary issues improve.   2.  HTN:  Stable.  Follow on bb.  3.  CKD III:  Stable.  Creat < 1.5.  4. Critical care:  She has significant lethargy this am. May have worsened respiratory acidosis.  Have ordered an ABG.  Not a candidate for TEE at this point

## 2014-09-21 NOTE — Care Management Note (Addendum)
    Page 1 of 1   09-29-14     9:34:44 AM CARE MANAGEMENT NOTE 09/29/2014  Patient:  Natalie Hicks, Natalie Hicks   Account Number:  [id number]  Date Initiated:  09/20/2014  Documentation initiated by:  Junius Creamer  Subjective/Objective Assessment:   adm w at fib w rvr     Action/Plan:   from assisted living facility   Anticipated DC Date:     Anticipated DC Plan:  ASSISTED LIVING / REST HOME  In-house referral  Clinical Social Worker         Choice offered to / List presented to:             Status of service:   Medicare Important Message given?  YES (If response is "NO", the following Medicare IM given date fields will be blank) Date Medicare IM given:  09/21/2014 Medicare IM given by:  Junius Creamer Date Additional Medicare IM given:  09/29/14 Additional Medicare IM given by:  [facility] Shayne Diguglielmo  Discharge Disposition:    Per UR Regulation:  Reviewed for med. necessity/level of care/duration of stay  If discussed at Long Length of Stay Meetings, dates discussed:   09/23/2014    Comments:  9/24 1250p debbie Natalie Stcharles rn,bsn pt will have 70.00 per month copay for eliquis and no prior auth req. on vent at present will wait to give pt free card.

## 2014-09-21 NOTE — Progress Notes (Addendum)
PULMONARY / CRITICAL CARE MEDICINE   Name: Natalie Hicks MRN: [id number] DOB: October 20, 1925    ADMISSION DATE:  09/28/2014   INITIAL PRESENTATION:  29F admitted to [facility] service 9/19 with new onset AF with RVR and dyspnea due to CHF(acute diastolic). Transferred to ICU/SDU 9/20 with increased SOB. Did not require vent support  STUDIES:  9/19 TTE: LVEF 50-55%. LA moderately dilated. RA mildly dilated. PASP est 52 mmHg  SIGNIFICANT EVENTS: 9/22>>worsening respiratory status, more lethagic   HISTORY OF PRESENT ILLNESS:   Admission history reviewed. Transferred to ICU/SDU due to lethargy and abnormal ABG with intention of implementing NPPV. However, on arrival, pt was fully alert and in no respiratory distress. She denies dyspnea and chest pain  Subjective: Patient with worsening resp status, more lethagic this morning, but able to state DOB and respond to questions with stiumlation   Prior to Admission medications   Medication Sig Start Date End Date Taking? Authorizing Provider  acetaminophen (TYLENOL) 325 MG tablet Take 650 mg by mouth every 6 (six) hours as needed for mild pain.   Yes Historical Provider, MD  atenolol-chlorthalidone (TENORETIC) 50-25 MG per tablet Take 1 tablet by mouth at bedtime.   Yes Historical Provider, MD  B Complex-C (B-COMPLEX WITH VITAMIN C) tablet Take 1 tablet by mouth daily.   Yes Historical Provider, MD  calcium carbonate (OS-CAL) 600 MG TABS Take 600 mg by mouth daily after lunch.   Yes Historical Provider, MD  cetirizine (ZYRTEC) 10 MG tablet Take 10 mg by mouth daily as needed for allergies or rhinitis.   Yes Historical Provider, MD  cholecalciferol (VITAMIN D) 1000 UNITS tablet Take 1,000 Units by mouth daily after lunch.   Yes Historical Provider, MD  docusate sodium (COLACE) 100 MG capsule Take 100 mg by mouth 2 (two) times daily as needed for mild constipation.   Yes Historical Provider, MD  Fluticasone-Salmeterol (ADVAIR) 250-50 MCG/DOSE AEPB  Inhale 1 puff into the lungs 2 (two) times daily.   Yes Historical Provider, MD  glyBURIDE (DIABETA) 2.5 MG tablet Take 2.5 mg by mouth daily.   Yes Historical Provider, MD  guaiFENesin (MUCINEX) 600 MG 12 hr tablet Take 1,200 mg by mouth 2 (two) times daily as needed (for congestion).   Yes Historical Provider, MD  guaiFENesin (ROBITUSSIN) 100 MG/5ML SOLN Take 5 mLs by mouth every 6 (six) hours as needed for cough or to loosen phlegm.   Yes Historical Provider, MD  loperamide (IMODIUM) 2 MG capsule Take 2 mg by mouth as needed for diarrhea or loose stools.   Yes Historical Provider, MD  losartan (COZAAR) 100 MG tablet Take 100 mg by mouth every morning.   Yes Historical Provider, MD  metFORMIN (GLUCOPHAGE) 1000 MG tablet Take 1,000 mg by mouth 2 (two) times daily with a meal.   Yes Historical Provider, MD  pravastatin (PRAVACHOL) 20 MG tablet Take 20 mg by mouth at bedtime.   Yes Historical Provider, MD  tamsulosin (FLOMAX) 0.4 MG CAPS capsule Take 0.4 mg by mouth daily as needed (for bladder spasms).   Yes Historical Provider, MD  timolol (TIMOPTIC-XR) 0.5 % ophthalmic gel-forming Place 1 drop into both eyes at bedtime.   Yes Historical Provider, MD   Allergies  Allergen Reactions  . Actos [Pioglitazone] Anaphylaxis  . Avandia [Rosiglitazone] Shortness Of Breath  . Aspirin Other (See Comments)    Reaction: cramping and GI upset  . Celecoxib Other (See Comments)    Reaction: GI upset  . Penicillins Hives  FAMILY HISTORY:  Family History  Problem Relation Age of Onset  . Hypertension Mother      VITAL SIGNS: Temp:  [97.4 F (36.3 C)-98.3 F (36.8 C)] 98.3 F (36.8 C) (09/22 0730) Pulse Rate:  [67-106] 89 (09/22 0740) Resp:  [14-27] 23 (09/22 0740) BP: (85-125)/(26-86) 112/86 mmHg (09/22 0740) SpO2:  [96 %-100 %] 99 % (09/22 0740) Weight:  [179 lb 10.8 oz (81.5 kg)] 179 lb 10.8 oz (81.5 kg) (09/22 0500)   INTAKE / OUTPUT:  Intake/Output Summary (Last 24 hours) at 09/21/14  0912 Last data filed at 09/21/14 0700  Gross per 24 hour  Intake  840.4 ml  Output    675 ml  Net  165.4 ml    PHYSICAL EXAMINATION: General:  Pleasant, NAD, RASS 0 Neuro:  No focal deficits HEENT:  Normal Cardiovascular: IRIR, no M Lungs:  Bibasilar crackles (worsening), no wheezes Abdomen:  Soft, NT, +BS Ext: warm, no edema  LABS:  CBC  Recent Labs Lab 09/19/14 2028 09/20/14 0254 09/21/14 0210  WBC 8.6 7.0 10.2  HGB 9.7* 8.5* 9.9*  HCT 31.1* 28.8* 31.4*  PLT 223 205 193   Coag's No results found for this basename: APTT, INR,  in the last 168 hours BMET  Recent Labs Lab 09/02/2014 0116 09/19/14 0545  NA 131* 134*  K 5.3 4.7  CL 95* 98  CO2 22 25  BUN 35* 46*  CREATININE 0.90 1.04  GLUCOSE 212* 192*   Electrolytes  Recent Labs Lab 09/21/2014 0116 09/15/2014 1503 09/19/14 0545  CALCIUM 10.1  --  9.7  MG  --  1.3* 1.9   Sepsis Markers  Recent Labs Lab 09/20/14 1929 09/21/14 0210  PROCALCITON <0.10 <0.10   ABG  Recent Labs Lab 09/19/14 1945  PHART 7.245*  PCO2ART 65.4*  PO2ART 80.0   Liver Enzymes No results found for this basename: AST, ALT, ALKPHOS, BILITOT, ALBUMIN,  in the last 168 hours Cardiac Enzymes  Recent Labs Lab 09/12/2014 0116 09/07/2014 0905 08/31/2014 1503  TROPONINI <0.30 <0.30 <0.30  PROBNP 6065.0*  --   --    Glucose  Recent Labs Lab 09/20/14 0003 09/20/14 0748 09/20/14 1301 09/20/14 1753 09/20/14 2108 09/21/14 0739  GLUCAP 188* 223* 235* 216* 192* 296*    CXR: mild interstitial edema pattern  ASSESSMENT: Acute hypoxic resp failure B\L Pulm Edema Mild-mod PAH - likely post-capillary hypertension Abnormal lab (ABG) - likely a VBG on 9/20 AFRVR - improved rate control on amiodarone gtt  Cards following Anemia  PLAN/RECS: Continue to monitor in the ICU Incentive spirometry (not able to perform) 9/20>>Respiratory acidosis noted on ABG - this was on admission, her clinical status at the time of admission  did not reflect the ABG finding, make this lab a possible VBG.  Planned for NIPPV (CPAP\BiPaP), but patient would not wear it. 9/22>> worsening respiratory status, will check ABG, give lasix  IV x 1, attempt Bipap again.  If not improvement, will require intubation.  TEE cardioversion per Cards once her respiratory status is optimized Diuresis as permitted by BP and renal function. Attempted to call son, Maebell Lyvers [facility]), who is at the dentist and not able to answer his phone, per his wife.   Critical Care time - 35 mins  Stephanie Acre, MD [facility] Pager [phone] On Call Pager [phone]

## 2014-09-21 NOTE — Procedures (Signed)
Present for procedure  Natalie Kamphuis, MD [facility] Pager - [phone] On Call Pager - [phone]  

## 2014-09-21 NOTE — Procedures (Signed)
Central Venous Catheter Insertion Procedure Note Natalie Hicks [id number] [birth year]  Procedure: Insertion of Central Venous Catheter Indications: Assessment of intravascular volume, Drug and/or fluid administration and Frequent blood sampling  Procedure Details Consent: Risks of procedure as well as the alternatives and risks of each were explained to the (patient/caregiver).  Consent for procedure obtained.  Emergent consent with Son via phone per Dr. Dema Severin.    Time Out: Verified patient identification, verified procedure, site/side was marked, verified correct patient position, special equipment/implants available, medications/allergies/relevent history reviewed, required imaging and test results available.  Performed  Maximum sterile technique was used including antiseptics, cap, gloves, gown, hand hygiene, mask and sheet. Skin prep: Chlorhexidine; local anesthetic administered A triple lumen catheter was placed in the left internal jugular vein to 18 cm using the Seldinger technique.  Evaluation Blood flow good Complications: No apparent complications Patient did tolerate procedure well. Chest X-ray ordered to verify placement.  CXR: pending.  Procedure performed under direct supervision of Dr. Dema Severin and with ultrasound guidance for real time vessel cannulation.      Canary Brim, NP-C [facility] Pgr: [phone] or [phone]    09/21/2014, 10:22 AM

## 2014-09-21 NOTE — Progress Notes (Signed)
OT Cancellation Note  Patient Details Name: Natalie Hicks MRN: [id number] DOB: 1925/10/09   Cancelled Treatment:    Reason Eval/Treat Not Completed: Medical issues which prohibited therapy. Pt with worsening respiratory status and lethargy. Will continue to follow and initiate OT as appropriate.  Evern Bio 09/21/2014, 1:03 PM [phone]

## 2014-09-22 ENCOUNTER — Inpatient Hospital Stay (HOSPITAL_COMMUNITY): Payer: Medicare Other

## 2014-09-22 DIAGNOSIS — N179 Acute kidney failure, unspecified: Secondary | ICD-10-CM

## 2014-09-22 DIAGNOSIS — I959 Hypotension, unspecified: Secondary | ICD-10-CM

## 2014-09-22 DIAGNOSIS — E871 Hypo-osmolality and hyponatremia: Secondary | ICD-10-CM

## 2014-09-22 LAB — BASIC METABOLIC PANEL
ANION GAP: 16 — AB (ref 5–15)
BUN: 64 mg/dL — ABNORMAL HIGH (ref 6–23)
CHLORIDE: 89 meq/L — AB (ref 96–112)
CO2: 24 meq/L (ref 19–32)
Calcium: 8.9 mg/dL (ref 8.4–10.5)
Creatinine, Ser: 1.78 mg/dL — ABNORMAL HIGH (ref 0.50–1.10)
GFR calc non Af Amer: 24 mL/min — ABNORMAL LOW (ref >90)
GFR, EST AFRICAN AMERICAN: 28 mL/min — AB (ref >90)
Glucose, Bld: 118 mg/dL — ABNORMAL HIGH (ref 70–99)
Potassium: 4.2 mEq/L (ref 3.7–5.3)
SODIUM: 129 meq/L — AB (ref 137–147)

## 2014-09-22 LAB — PROCALCITONIN: Procalcitonin: <0.1 ng/mL

## 2014-09-22 LAB — BLOOD GAS, ARTERIAL
ACID-BASE EXCESS: 0.5 mmol/L (ref 0.0–2.0)
BICARBONATE: 25.1 meq/L — AB (ref 20.0–24.0)
Drawn by: 232811
FIO2: 0.4 %
O2 Saturation: 97.9 %
PEEP: 5 cmH2O
PH ART: 7.377 (ref 7.350–7.450)
Patient temperature: 98.6
RATE: 16 resp/min
TCO2: 26.4 mmol/L (ref 0–100)
VT: 420 mL
pCO2 arterial: 43.8 mmHg (ref 35.0–45.0)
pO2, Arterial: 127 mmHg — ABNORMAL HIGH (ref 80.0–100.0)

## 2014-09-22 LAB — URINE CULTURE: Colony Count: >=100000

## 2014-09-22 LAB — CBC
HCT: 27.2 % — ABNORMAL LOW (ref 36.0–46.0)
Hemoglobin: 9 g/dL — ABNORMAL LOW (ref 12.0–15.0)
MCH: 29.1 pg (ref 26.0–34.0)
MCHC: 33.1 g/dL (ref 30.0–36.0)
MCV: 88 fL (ref 78.0–100.0)
PLATELETS: 266 10*3/uL (ref 150–400)
RBC: 3.09 MIL/uL — AB (ref 3.87–5.11)
RDW: 14.8 % (ref 11.5–15.5)
WBC: 11.4 10*3/uL — ABNORMAL HIGH (ref 4.0–10.5)

## 2014-09-22 LAB — GLUCOSE, CAPILLARY
GLUCOSE-CAPILLARY: 150 mg/dL — AB (ref 70–99)
GLUCOSE-CAPILLARY: 114 mg/dL — AB (ref 70–99)
Glucose-Capillary: 104 mg/dL — ABNORMAL HIGH (ref 70–99)
Glucose-Capillary: 108 mg/dL — ABNORMAL HIGH (ref 70–99)
Glucose-Capillary: 120 mg/dL — ABNORMAL HIGH (ref 70–99)
Glucose-Capillary: 129 mg/dL — ABNORMAL HIGH (ref 70–99)

## 2014-09-22 MED ORDER — FAMOTIDINE IN NACL 20-0.9 MG/50ML-% IV SOLN
20.0000 mg | INTRAVENOUS | Status: DC
  Administered 2014-09-23 – 2014-09-24 (×2): 20 mg via INTRAVENOUS
  Filled 2014-09-22 (×2): qty 50

## 2014-09-22 MED ORDER — FUROSEMIDE 10 MG/ML IJ SOLN
20.0000 mg | Freq: Every day | INTRAMUSCULAR | Status: DC
  Administered 2014-09-22 – 2014-09-24 (×3): 20 mg via INTRAVENOUS
  Filled 2014-09-22 (×2): qty 2

## 2014-09-22 MED ORDER — APIXABAN 5 MG PO TABS: 5.0000 mg | ORAL_TABLET | Freq: Two times a day (BID) | ORAL | Status: DC

## 2014-09-22 MED ORDER — METOPROLOL TARTRATE 25 MG/10 ML ORAL SUSPENSION
50.0000 mg | Freq: Two times a day (BID) | ORAL | Status: DC
  Administered 2014-09-22: 50 mg via ORAL
  Filled 2014-09-22 (×2): qty 20

## 2014-09-22 MED ORDER — METOPROLOL TARTRATE 50 MG PO TABS
50.0000 mg | ORAL_TABLET | Freq: Two times a day (BID) | ORAL | Status: DC
  Filled 2014-09-22 (×2): qty 1

## 2014-09-22 MED ORDER — APIXABAN 2.5 MG PO TABS
2.5000 mg | ORAL_TABLET | Freq: Two times a day (BID) | ORAL | Status: DC
  Administered 2014-09-22 – 2014-09-24 (×5): 2.5 mg
  Filled 2014-09-22 (×7): qty 1

## 2014-09-22 MED ORDER — DEXTROSE 5 % IV SOLN
1.0000 g | INTRAVENOUS | Status: DC
  Administered 2014-09-22 – 2014-09-23 (×2): 1 g via INTRAVENOUS
  Filled 2014-09-22 (×3): qty 10

## 2014-09-22 NOTE — Progress Notes (Addendum)
PULMONARY / CRITICAL CARE MEDICINE   Name: Natalie Hicks MRN: [id number] DOB: 07/11/1925    ADMISSION DATE:  October 12, 2014   INITIAL PRESENTATION:  58F admitted to [facility] service 9/19 with new onset AF with RVR and dyspnea due to CHF(acute diastolic). Transferred to ICU/SDU 9/20 with increased SOB. Did not require vent support  STUDIES:  9/19 TTE: LVEF 50-55%. LA moderately dilated. RA mildly dilated. PASP est 52 mmHg 9/22 CXR: new bilateral basilar opacities SIGNIFICANT EVENTS: 9/22>>worsening respiratory status, more lethargic>>intubated   HISTORY OF PRESENT ILLNESS:   Admission history reviewed. Transferred to ICU/SDU due to lethargy and abnormal ABG with intention of implementing NPPV. However, on arrival, pt was fully alert and in no respiratory distress. She denies dyspnea and chest pain  Subjective: Worsening resp status yesterday, intubated, overnight with hypotension, started on phenylephrine, currently with weaning sedation this morning.  Cardiology following, still with A. Fib, but rate control at this time, therefore no emergent need for TTE, per cardiology   VITAL SIGNS: Temp:  [97.7 F (36.5 C)-98.9 F (37.2 C)] 97.7 F (36.5 C) (09/23 0700) Pulse Rate:  [33-132] 98 (09/23 0835) Resp:  [14-35] 23 (09/23 0835) BP: (81-135)/(33-87) 135/52 mmHg (09/23 0835) SpO2:  [87 %-100 %] 96 % (09/23 0835) Arterial Line BP: (73-133)/(41-73) 111/45 mmHg (09/23 0800) FiO2 (%):  [40 %-60 %] 40 % (09/23 0835) Weight:  [179 lb 14.3 oz (81.6 kg)] 179 lb 14.3 oz (81.6 kg) (09/23 0500) Vent Mode:  [-] PSV;CPAP FiO2 (%):  [40 %-60 %] 40 % Set Rate:  [16 bmp] 16 bmp Vt Set:  [420 mL] 420 mL PEEP:  [5 cmH20] 5 cmH20 Pressure Support:  [14 cmH20] 14 cmH20 Plateau Pressure:  [20 cmH20-29 cmH20] 20 cmH20 INTAKE / OUTPUT:  Intake/Output Summary (Last 24 hours) at 09/22/14 5621 Last data filed at 09/22/14 0800  Gross per 24 hour  Intake 1355.44 ml  Output   1355 ml  Net   0.44 ml     PHYSICAL EXAMINATION: General:  Pleasant, NAD, RASS 0 Neuro:  No focal deficits HEENT:  Normal Cardiovascular: IRIR, no Murmur, S1, S2 Lungs:  Good airway entry, coarse upper airway sounds (intubated), mild fine basilar crackles.  Abdomen:  Soft, NT, +BS Ext: warm, no edema  LABS:  CBC  Recent Labs Lab 09/20/14 0254 09/21/14 0210 09/22/14 0410  WBC 7.0 10.2 11.4*  HGB 8.5* 9.9* 9.0*  HCT 28.8* 31.4* 27.2*  PLT 205 193 266   Coag's No results found for this basename: APTT, INR,  in the last 168 hours BMET  Recent Labs Lab 09/19/14 0545 09/21/14 0930 09/22/14 0410  NA 134* 130* 129*  K 4.7 4.9 4.2  CL 98 91* 89*  CO2 BUN 46* 61* 64*  CREATININE 1.04 1.74* 1.78*  GLUCOSE 192* 175* 118*   Electrolytes  Recent Labs Lab 2014/10/12 0116 2014/10/12 1503 09/19/14 0545 09/21/14 0930 09/22/14 0410  CALCIUM 10.1  --  9.7 9.1 8.9  MG  --  1.3* 1.9  --   --    Sepsis Markers  Recent Labs Lab 09/20/14 1929 09/21/14 0210 09/22/14 0410  PROCALCITON <0.10 <0.10 <0.10   ABG  Recent Labs Lab 09/21/14 1242 09/21/14 1720 09/22/14 0345  PHART 7.351 7.354 7.377  PCO2ART 50.5* 45.4* 43.8  PO2ART 173.0* 96.7 127.0*   Liver Enzymes No results found for this basename: AST, ALT, ALKPHOS, BILITOT, ALBUMIN,  in the last 168 hours Cardiac Enzymes  Recent Labs Lab 10-12-2014 0116  09/13/2014 0905 09/02/2014 1503  TROPONINI <0.30 <0.30 <0.30  PROBNP 6065.0*  --   --    Glucose  Recent Labs Lab 09/21/14 1202 09/21/14 1722 09/21/14 2016 09/21/14 2322 09/22/14 0346 09/22/14 0731  GLUCAP 241* 129* 109* 106* 120* 129*    CXR: mild interstitial edema pattern  ASSESSMENT\PLAN:  PULMONARY Acute hypoxic resp failure>>intubated (9/22) B\L Pulm Edema Mild-mod PAH - likely post-capillary hypertension  Continue to monitor in the ICU - intubated\sedated - cont with MV and wean as tolerated - still with afib but now rate controlled, no TEE today -  optimizing her card function will paramount to her respiratory status, appreciate cardiology input - bibasilar opacities with pulmonary edema - most likely atelectasis given her immobility and poor respiratory effort over the past 24 hours. Unlikely pneumonia given the acute changes with her respiratory status and CXR findings of pulmonary edema and with bilateral opacities (basilar) developing acutely within 24 hrs.  - cont with lasix as tolerated with close monitoring of her renal fcn  CARDIOLOGY AFRVR Hypotension   - improved rate control initially with amiodarone gtt, then transition to metoprolol.  Metoprolol held today due to hypotension and patient being on phenylephrine. Heparin gtt stopped and patient started on Eliquis. - cont with phenylephrine to maintain a MAP>65 - TEE possibly in the near future - LV Fcn on TTE could not fully be assessed due to patient being in afib, per report she has moderate LA\RA dilation, along with mod-sev TR regurge. Suspect she has some diastolic dysfunction and will benefit from diuresis and afterload reduction along with afib control.   ID - UTI E.col (Ur Cx 9/21) on cipro (day 3/10) - await Sensitivities and adjust antibiotics  PCT < 0.1 x 3  RENAL - AKI, elevated creatinine - secondary to diuresis, hypotension, poor perfusion - adjusted lasix to  IV daily - gentle fluid boluses if needed - monitor I\O, keep net even for now given need to pressor - baseline sCr~1.1 now 1.78, ~60%inc from baseline - con't to monitor closely, avoid hypotension, nephrotoxic drugs, NSAIDs  Heme Anemia - Anemia of Chronic Disease - H\H stable - monitor H\H   Critical Care time - 35 mins  Stephanie Acre, MD [facility] Pager [phone] On Call Pager [phone]

## 2014-09-22 NOTE — Progress Notes (Signed)
ANTIBIOTIC CONSULT NOTE - INITIAL  Pharmacy Consult for Ceftriaxone Indication: Ecoli UTI  Allergies  Allergen Reactions  . Actos [Pioglitazone] Anaphylaxis  . Avandia [Rosiglitazone] Shortness Of Breath  . Aspirin Other (See Comments)    Reaction: cramping and GI upset  . Celecoxib Other (See Comments)    Reaction: GI upset  . Penicillins Hives    Patient Measurements: Height:  (157.5 cm) Weight: 179 lb 14.3 oz (81.6 kg) IBW/kg (Calculated) : 50.1 Adjusted Body Weight:   Vital Signs: Temp: 98 F (36.7 C) (09/23 1100) Temp src: Oral (09/23 1100) BP: 106/88 mmHg (09/23 1300) Pulse Rate: 78 (09/23 1300) Intake/Output from previous day: 09/22 0701 - 09/23 0700 In: 1372.1 [I.V.:1012.1; NG/GT:110; IV Piggyback:250] Out: 1245 [Urine:1245] Intake/Output from this shift: Total I/O In: 270.9 [I.V.:190.9; NG/GT:30; IV Piggyback:50] Out: 350 [Urine:350]  Labs:  Recent Labs  09/20/14 0254 09/21/14 0210 09/21/14 0930 09/22/14 0410  WBC 7.0 10.2  --  11.4*  HGB 8.5* 9.9*  --  9.0*  PLT 205 193  --  266  CREATININE  --   --  1.74* 1.78*   Estimated Creatinine Clearance: 22 ml/min (by C-G formula based on Cr of 1.78). No results found for this basename: VANCOTROUGH, Leodis Binet, VANCORANDOM, GENTTROUGH, GENTPEAK, GENTRANDOM, TOBRATROUGH, TOBRAPEAK, TOBRARND, AMIKACINPEAK, AMIKACINTROU, AMIKACIN,  in the last 72 hours   Microbiology: Recent Results (from the past 720 hour(s))  MRSA PCR SCREENING     Status: None   Collection Time    09/19/14  7:52 PM      Result Value Ref Range Status   MRSA by PCR NEGATIVE  NEGATIVE Final   Comment:            The GeneXpert MRSA Assay (FDA     approved for NASAL specimens     only), is one component of a     comprehensive MRSA colonization     surveillance program. It is not     intended to diagnose MRSA     infection nor to guide or     monitor treatment for     MRSA infections.  URINE CULTURE     Status: None   Collection  Time    09/20/14  5:07 AM      Result Value Ref Range Status   Specimen Description URINE, RANDOM   Final   Special Requests NONE   Final   Culture  Setup Time     Final   Value: 09/20/2014 10:18     Performed at [facility] Count     Final   Value: >=100,000 COLONIES/ML     Performed at [facility]   Culture     Final   Value: ESCHERICHIA COLI     Performed at [facility]   Report Status 09/22/2014 FINAL   Final   Organism ID, Bacteria ESCHERICHIA COLI   Final    Medical History: Past Medical History  Diagnosis Date  . Diabetes   . Chronic kidney disease   . Hypertension   . A-fib     a. Dx 08/2014, CHA2DS2VASc = 5;  b. 08/2014 Echo: EF 50-55%, mod MR, mod dil LA, mod-sev TR, PASP ..   Assessment: 89yof on Cipro for suspected UTI. Urine culture has grown Ecoli resistant to Cipro (sensitive to cefazolin, ceftriaxone, AGs, Zosyn, Bactrim). Patient has allergy to PCN (hives) and renal function is worsening which is limiting antibiotic choices. Discussed with Dr. Dema Severin - received verbal  order to switch to Ceftriaxone (MD aware of allergy) and monitor patient.    Plan:  1. Discontinue Cipro 2. Ceftriaxone 1g IV q24h 3. Monitor patient for s/sx of reaction (notified RN)  Cleon Dew [phone] 09/22/2014,1:51 PM

## 2014-09-22 NOTE — Progress Notes (Signed)
CARDIOLOGY CONSULT NOTE   Patient ID: Natalie Hicks MRN: [id number], DOB/AGE: 1925-11-02   Admit date: 08/31/2014 Date of Consult: 09/22/2014  Primary Physician: Lupita Raider, MD Primary Cardiologist: new to  - seen by Katherina Right, MD  Pt. Profile  80 y/o female without prior cardiac hx, who presented to the ED on 9/18 with dyspnea.   She was found to have a-fib with RVR.  She was transferred to the CCU several days ago for increased respiratory distress - had a respiratory acidosis.   She was intubated yesterday. Her HR has been well controlled.  HR has increased a bit this am due to aggitation from being on the vent.   Problem List  Past Medical History  Diagnosis Date  . Diabetes   . Chronic kidney disease   . Hypertension   . A-fib     a. Dx 08/2014, CHA2DS2VASc = 5;  b. 08/2014 Echo: EF 50-55%, mod MR, mod dil LA, mod-sev TR, PASP .Marland Kitchen    Past Surgical History  Procedure Laterality Date  . Tonsillectomy    . Hemrhoidectomy      Allergies  Allergies  Allergen Reactions  . Actos [Pioglitazone] Anaphylaxis  . Avandia [Rosiglitazone] Shortness Of Breath  . Aspirin Other (See Comments)    Reaction: cramping and GI upset  . Celecoxib Other (See Comments)    Reaction: GI upset  . Penicillins Hives     80 y/o female with the above problem list.  She does not have a prior cardiac hx.  She also has a h/o chronic cough and was treated w/ outpt abx in June.  At baseline, she lives by herself in assisted living and generally uses a walker to get around.  She has no h/o falls and tries to walk around her building 4x/day.  She has had worsening dyspnea with CO2 retention while up on 3E.  Was transferred to CCU.    Inpatient Medications  . antiseptic oral rinse  7 mL Mouth Rinse QID  . apixaban  2.5 mg Per Tube BID  . chlorhexidine  15 mL Mouth Rinse BID  . cholecalciferol  1,000 Units Oral QPC lunch  . ciprofloxacin  200 mg Intravenous Q12H  . famotidine  (PEPCID) IV  20 mg Intravenous Q12H  . furosemide  20 mg Intravenous Daily  . insulin aspart  2-6 Units Subcutaneous 6 times per day  . ipratropium  0.5 mg Nebulization TID  . levalbuterol  0.63 mg Nebulization TID  . multivitamin  5 mL Oral Daily  . sodium chloride  3 mL Intravenous Q12H  . timolol  1 drop Both Eyes QHS   Family History Family History  Problem Relation Age of Onset  . Hypertension Mother     Social History History   Social History  . Marital Status: Divorced    Spouse Name: N/A    Number of Children: N/A  . Years of Education: N/A   Occupational History  . Not on file.   Social History Main Topics  . Smoking status: Former Games developer  . Smokeless tobacco: Not on file  . Alcohol Use: No  . Drug Use: No  . Sexual Activity: Not on file   Other Topics Concern  . Not on file   Social History Narrative   Lives in Assisted Living.      Physical Exam  Blood pressure 135/52, pulse 98, temperature 97.7 F (36.5 C), temperature source Oral, resp. rate 23, height  (1.575 m), weight 179  lb 14.3 oz (81.6 kg), SpO2 96.00%.  General: very lethergic this am .  Difficult to arouse Psych: lethargic today Neuro:   HEENT: Normal  Neck: Supple without bruits or JVD. Lungs:  Rales on right base.    Heart: IR, IR, distant, no s3, s4, or murmurs. Abdomen: Soft, non-tender, non-distended, BS + x 4.  Extremities: trace edema. DP/PT/Radials 2+ and equal bilaterally.  Labs  No results found for this basename: CKTOTAL, CKMB, TROPONINI,  in the last 72 hours Lab Results  Component Value Date   WBC 11.4* 09/22/2014   HGB 9.0* 09/22/2014   HCT 27.2* 09/22/2014   MCV 88.0 09/22/2014   PLT 266 09/22/2014     Recent Labs Lab 09/22/14 0410  NA 129*  K 4.2  CL 89*  CO2 24  BUN 64*  CREATININE 1.78*  CALCIUM 8.9  GLUCOSE 118*   Radiology/Studies  Dg Chest 2 View  09/17/2014   CLINICAL DATA:  Cough, shortness of Breath  EXAM: CHEST  2 VIEW  COMPARISON:   06/16/2014  FINDINGS: Cardiomegaly is noted. No acute infiltrate or pulmonary edema. Stable chronic interstitial prominence. Osteopenia and degenerative changes thoracic spine. Prior vertebroplasty lumbar spine.  IMPRESSION: No infiltrate or pulmonary edema. Cardiomegaly. Chronic interstitial prominence.   Electronically Signed   By: Natasha Mead M.D.   On: 09/17/2014 16:25   2D Echocardiogram 9.19.2015  Study Conclusions  - Left ventricle: The cavity size was normal. There was mild focal   basal hypertrophy of the septum. Systolic function was normal.   The estimated ejection fraction was in the range of 50% to 55%.   Regional wall motion abnormalities cannot be excluded. - Ventricular septum: Septal motion showed paradox. - Mitral valve: Calcified annulus. Moderately thickened, mildly   calcified leaflets . There was moderate regurgitation. - Left atrium: The atrium was moderately dilated. - Right atrium: The atrium was mildly dilated. - Tricuspid valve: There was moderate-severe regurgitation. - Pulmonary arteries: PA peak pressure: 52 mm Hg (S). _____________   ECG  Afib, 90, lbbb  ASSESSMENT AND PLAN  1. Afib RVR:  Her rate is ok  Still a bit tachy   She is on Eliquis. Her rate is fairly well controlled.  I do not think that she needs an urgent cardioversion .  Can consider CV as her pulmonary issues improve.  Before she was intubated , she had said that she did not want to have a cardioversion.  At this point, I dont think that she needs this done as long as we can control the HR. Her metoprolol was held this am.  I will restart her metoprolol to improve rate control.  Continue amio.   2.  HTN:  Has been on phenylephrine .     3.  CKD III:  Stable.  Creat < 1.5.   Vesta Mixer, Montez Hageman., MD, [facility] 09/22/2014, 9:45 AM [address]  [address] Office [phone] Pager [phone]

## 2014-09-23 ENCOUNTER — Inpatient Hospital Stay (HOSPITAL_COMMUNITY): Payer: Medicare Other

## 2014-09-23 LAB — CBC
HCT: 27.8 % — ABNORMAL LOW (ref 36.0–46.0)
HEMOGLOBIN: 9.1 g/dL — AB (ref 12.0–15.0)
MCH: 28.4 pg (ref 26.0–34.0)
MCHC: 32.7 g/dL (ref 30.0–36.0)
MCV: 86.9 fL (ref 78.0–100.0)
PLATELETS: 228 10*3/uL (ref 150–400)
RBC: 3.2 MIL/uL — AB (ref 3.87–5.11)
RDW: 14.7 % (ref 11.5–15.5)
WBC: 9 10*3/uL (ref 4.0–10.5)

## 2014-09-23 LAB — GLUCOSE, CAPILLARY
GLUCOSE-CAPILLARY: 106 mg/dL — AB (ref 70–99)
GLUCOSE-CAPILLARY: 124 mg/dL — AB (ref 70–99)
GLUCOSE-CAPILLARY: 125 mg/dL — AB (ref 70–99)
Glucose-Capillary: 112 mg/dL — ABNORMAL HIGH (ref 70–99)
Glucose-Capillary: 111 mg/dL — ABNORMAL HIGH (ref 70–99)
Glucose-Capillary: 129 mg/dL — ABNORMAL HIGH (ref 70–99)

## 2014-09-23 LAB — BASIC METABOLIC PANEL
Anion gap: 15 (ref 5–15)
BUN: 60 mg/dL — ABNORMAL HIGH (ref 6–23)
CO2: 25 meq/L (ref 19–32)
CREATININE: 1.69 mg/dL — AB (ref 0.50–1.10)
Calcium: 8.7 mg/dL (ref 8.4–10.5)
Chloride: 90 mEq/L — ABNORMAL LOW (ref 96–112)
GFR calc Af Amer: 30 mL/min — ABNORMAL LOW (ref >90)
GFR, EST NON AFRICAN AMERICAN: 26 mL/min — AB (ref >90)
GLUCOSE: 129 mg/dL — AB (ref 70–99)
Potassium: 3.6 mEq/L — ABNORMAL LOW (ref 3.7–5.3)
SODIUM: 130 meq/L — AB (ref 137–147)

## 2014-09-23 LAB — PHOSPHORUS: PHOSPHORUS: 3.7 mg/dL (ref 2.3–4.6)

## 2014-09-23 LAB — MAGNESIUM: MAGNESIUM: 1.5 mg/dL (ref 1.5–2.5)

## 2014-09-23 MED ORDER — POTASSIUM CHLORIDE 20 MEQ/15ML (10%) PO LIQD
ORAL | Status: AC
  Administered 2014-09-23: 40 meq
  Filled 2014-09-23: qty 30

## 2014-09-23 MED ORDER — FENTANYL CITRATE 0.05 MG/ML IJ SOLN: 50.0000 ug | INTRAMUSCULAR | Status: DC | PRN

## 2014-09-23 MED ORDER — FENTANYL CITRATE 0.05 MG/ML IJ SOLN
50.0000 ug | INTRAMUSCULAR | Status: DC | PRN
  Administered 2014-09-23: 50 ug via INTRAVENOUS
  Filled 2014-09-23: qty 2

## 2014-09-23 MED ORDER — FENTANYL CITRATE 0.05 MG/ML IJ SOLN
50.0000 ug | Freq: Once | INTRAMUSCULAR | Status: AC
Start: 2014-09-23 — End: 2014-09-23

## 2014-09-23 MED ORDER — POTASSIUM CHLORIDE CRYS ER 20 MEQ PO TBCR: 40.0000 meq | EXTENDED_RELEASE_TABLET | Freq: Once | ORAL | Status: AC

## 2014-09-23 MED ORDER — SODIUM CHLORIDE 0.9 % IV SOLN: 0.0000 ug/h | INTRAVENOUS | Status: DC

## 2014-09-23 MED ORDER — FUROSEMIDE 10 MG/ML IJ SOLN
20.0000 mg | Freq: Once | INTRAMUSCULAR | Status: AC
  Administered 2014-09-23: 20 mg via INTRAVENOUS
  Filled 2014-09-23: qty 2

## 2014-09-23 MED ORDER — FENTANYL BOLUS VIA INFUSION
25.0000 ug | INTRAVENOUS | Status: DC | PRN
  Administered 2014-09-23: 50 ug via INTRAVENOUS
  Filled 2014-09-23: qty 50

## 2014-09-23 NOTE — Progress Notes (Signed)
CARDIOLOGY CONSULT NOTE   Patient ID: Natalie Hicks MRN: [id number], DOB/AGE: 78-Oct-1926   Admit date: 09-29-2014 Date of Consult: 09/23/2014  Primary Physician: Lupita Raider, MD Primary Cardiologist: new to  - seen by Katherina Right, MD  Pt. Profile  80 y/o female without prior cardiac hx, who presented to the ED on 9/18 with dyspnea.   She was found to have a-fib with RVR.  She was transferred to the CCU several days ago for increased respiratory distress - had a respiratory acidosis.   She was intubated yesterday. Her HR has been well controlled.  HR has increased a bit this am due to aggitation from being on the vent.   Problem List  Past Medical History  Diagnosis Date  . Diabetes   . Chronic kidney disease   . Hypertension   . A-fib     a. Dx 08/2014, CHA2DS2VASc = 5;  b. 08/2014 Echo: EF 50-55%, mod MR, mod dil LA, mod-sev TR, PASP .Marland Kitchen    Past Surgical History  Procedure Laterality Date  . Tonsillectomy    . Hemrhoidectomy      Allergies  Allergies  Allergen Reactions  . Actos [Pioglitazone] Anaphylaxis  . Avandia [Rosiglitazone] Shortness Of Breath  . Aspirin Other (See Comments)    Reaction: cramping and GI upset  . Celecoxib Other (See Comments)    Reaction: GI upset  . Penicillins Hives     80 y/o female with the above problem list.  She does not have a prior cardiac hx.  She also has a h/o chronic cough and was treated w/ outpt abx in June.  At baseline, she lives by herself in assisted living and generally uses a walker to get around.  She has no h/o falls and tries to walk around her building 4x/day.  She has had worsening dyspnea with CO2 retention while up on 3E.  Was transferred to CCU.    Inpatient Medications  . antiseptic oral rinse  7 mL Mouth Rinse QID  . apixaban  2.5 mg Per Tube BID  . cefTRIAXone (ROCEPHIN)  IV  1 g Intravenous Q24H  . chlorhexidine  15 mL Mouth Rinse BID  . cholecalciferol  1,000 Units Oral QPC lunch  .  famotidine (PEPCID) IV  20 mg Intravenous Q24H  . furosemide  20 mg Intravenous Daily  . insulin aspart  2-6 Units Subcutaneous 6 times per day  . ipratropium  0.5 mg Nebulization TID  . levalbuterol  0.63 mg Nebulization TID  . multivitamin  5 mL Oral Daily  . sodium chloride  3 mL Intravenous Q12H  . timolol  1 drop Both Eyes QHS   Family History Family History  Problem Relation Age of Onset  . Hypertension Mother     Social History History   Social History  . Marital Status: Divorced    Spouse Name: N/A    Number of Children: N/A  . Years of Education: N/A   Occupational History  . Not on file.   Social History Main Topics  . Smoking status: Former Games developer  . Smokeless tobacco: Not on file  . Alcohol Use: No  . Drug Use: No  . Sexual Activity: Not on file   Other Topics Concern  . Not on file   Social History Narrative   Lives in Assisted Living.      Physical Exam  Blood pressure 139/53, pulse 93, temperature 98.6 F (37 C), temperature source Oral, resp. rate 22, height  (1.575  m), weight 178 lb 5.6 oz (80.9 kg), SpO2 100.00%.  General: very lethergic this am .  Difficult to arouse Psych: lethargic today Neuro:   HEENT: Normal  Neck: Supple without bruits or JVD. Lungs:  Rales on right base.    Heart: IR, IR, distant, no s3, s4, or murmurs. Abdomen: Soft, non-tender, non-distended, BS + x 4.  Extremities: trace edema. DP/PT/Radials 2+ and equal bilaterally.  Labs  No results found for this basename: CKTOTAL, CKMB, TROPONINI,  in the last 72 hours Lab Results  Component Value Date   WBC 9.0 09/23/2014   HGB 9.1* 09/23/2014   HCT 27.8* 09/23/2014   MCV 86.9 09/23/2014   PLT 228 09/23/2014     Recent Labs Lab 09/23/14 0400  NA 130*  K 3.6*  CL 90*  CO2 25  BUN 60*  CREATININE 1.69*  CALCIUM 8.7  GLUCOSE 129*   Radiology/Studies  Dg Chest 2 View  09/17/2014   CLINICAL DATA:  Cough, shortness of Breath  EXAM: CHEST  2 VIEW  COMPARISON:   06/16/2014  FINDINGS: Cardiomegaly is noted. No acute infiltrate or pulmonary edema. Stable chronic interstitial prominence. Osteopenia and degenerative changes thoracic spine. Prior vertebroplasty lumbar spine.  IMPRESSION: No infiltrate or pulmonary edema. Cardiomegaly. Chronic interstitial prominence.   Electronically Signed   By: Natasha Mead M.D.   On: 09/17/2014 16:25   2D Echocardiogram 09/09/2014  Study Conclusions  - Left ventricle: The cavity size was normal. There was mild focal   basal hypertrophy of the septum. Systolic function was normal.   The estimated ejection fraction was in the range of 50% to 55%.   Regional wall motion abnormalities cannot be excluded. - Ventricular septum: Septal motion showed paradox. - Mitral valve: Calcified annulus. Moderately thickened, mildly   calcified leaflets . There was moderate regurgitation. - Left atrium: The atrium was moderately dilated. - Right atrium: The atrium was mildly dilated. - Tricuspid valve: There was moderate-severe regurgitation. - Pulmonary arteries: PA peak pressure: 52 mm Hg (S). _____________   ECG  Afib, 90, lbbb  ASSESSMENT AND PLAN  1. Afib RVR:  Her rate is ok  Rate is ok    She is on Eliquis. Her rate is fairly well controlled.  I do not think that she needs an urgent cardioversion .  Can consider CV as her pulmonary issues improve.  Before she was intubated , she had said that she did not want to have a cardioversion.  At this point, I dont think that she needs this done as long as we can control the HR.    Continue amio.   2.  HTN:  Has been on phenylephrine .     3.  CKD III:  Stable.  Creat < 1.5.   Vesta Mixer, Montez Hageman., MD, [facility] 09/23/2014, 8:59 AM [address],  [address] Office [phone] Pager [phone]

## 2014-09-23 NOTE — Progress Notes (Signed)
RN called to bedside d/t pt alarming low rate.  RR noted to be 8-10, low min volume.  Attempted to decrease PS in hopes pt RR would increase.  Pt still w/ low rate and low min volume.  Changed pt back to full vent support. RN aware.

## 2014-09-23 NOTE — Progress Notes (Signed)
Pt very restless and agitated and all attempts to reorient her to situation futile. Pt also reaching for ETT with mittens on. CCM notified. New orders received. Will cont to monitor.  Per Dr. Dema Severin, plan to extubate in am, Alert Night RN to stop sedation from 0400 so pt can be wide awake when he rounds in am. Will cont to monitor

## 2014-09-23 NOTE — Progress Notes (Signed)
eLink Physician-Brief Progress Note Patient Name: Natalie Hicks DOB: June 08, 1925 MRN: [id number]   Date of Service  09/23/2014  HPI/Events of Note  At risk self extub, aggitation increased  eICU Interventions  Restart fent drip     Intervention Category Major Interventions: Change in mental status - evaluation and management  Nelda Bucks. 09/23/2014, 5:16 PM

## 2014-09-23 NOTE — Progress Notes (Signed)
PT Cancellation Note  Patient Details Name: SHANTEL HELWIG MRN: [id number] DOB: [birth year]   Cancelled Treatment:    Reason Eval/Treat Not Completed: Medical issues which prohibited therapy. Per RN pt to wean today with ?extubation and she did not feel pt could tolerate PT at this time (concerned it would over-fatigue her).    Latayna Ritchie 09/23/2014, 1:30 PM Pager [phone]

## 2014-09-23 NOTE — Progress Notes (Signed)
PULMONARY / CRITICAL CARE MEDICINE   Name: EILISH MCDANIEL MRN: [id number] DOB: Aug 21, 1925    ADMISSION DATE:  09/03/2014   INITIAL PRESENTATION:  85F admitted to [facility] service 9/19 with new onset AF with RVR and dyspnea due to CHF(acute diastolic). Transferred to ICU/SDU 9/20 with increased SOB. Did not require vent support  STUDIES:  9/19 TTE: LVEF 50-55%. LA moderately dilated. RA mildly dilated. PASP est 52 mmHg 9/22 CXR: new bilateral basilar opacities SIGNIFICANT EVENTS: 9/22>>worsening respiratory status, more lethargic>>intubated   HISTORY OF PRESENT ILLNESS:   Admission history reviewed. Transferred to ICU/SDU due to lethargy and abnormal ABG with intention of implementing NPPV. However, on arrival, pt was fully alert and in no respiratory distress. She denies dyspnea and chest pain  Subjective: Mild agitation this morning off sedation, but with overall improvement. CXR show pulm edema improvement. Plan for sedation holiday and possible extubation in the next 1-2 days.  Cardiology following, still with A. Fib, but rate control at this time, therefore no emergent need for TTE, per cardiology. Son at bedside updated on treatment and management plan.    VITAL SIGNS: Temp:  [97.2 F (36.2 C)-98.7 F (37.1 C)] 98.7 F (37.1 C) (09/24 1200) Pulse Rate:  [77-117] 109 (09/24 1200) Resp:  [9-22] 13 (09/24 1200) BP: (79-141)/(36-88) 107/46 mmHg (09/24 1200) SpO2:  [97 %-100 %] 100 % (09/24 1200) Arterial Line BP: (101-148)/(44-87) 133/50 mmHg (09/24 1200) FiO2 (%):  [40 %] 40 % (09/24 1200) Weight:  [178 lb 5.6 oz (80.9 kg)] 178 lb 5.6 oz (80.9 kg) (09/24 0357) Vent Mode:  [-] PSV;CPAP FiO2 (%):  [40 %] 40 % Set Rate:  [16 bmp] 16 bmp Vt Set:  [420 mL] 420 mL PEEP:  [5 cmH20] 5 cmH20 Pressure Support:  [15 cmH20] 15 cmH20 Plateau Pressure:  [20 cmH20-33 cmH20] 33 cmH20 INTAKE / OUTPUT:  Intake/Output Summary (Last 24 hours) at 09/23/14 1225 Last data filed at 09/23/14  1200  Gross per 24 hour  Intake 904.49 ml  Output   1285 ml  Net -380.51 ml    PHYSICAL EXAMINATION: General:  Pleasant, NAD, RASS 0 Neuro:  No focal deficits HEENT:  Normal Cardiovascular: IRIR, no Murmur, S1, S2 Lungs:  Good airway entry, coarse upper airway sounds (intubated), mild fine basilar crackles.  Abdomen:  Soft, NT, +BS Ext: warm, mild +1pitting edema.   LABS:  CBC  Recent Labs Lab 09/21/14 0210 09/22/14 0410 09/23/14 0400  WBC 10.2 11.4* 9.0  HGB 9.9* 9.0* 9.1*  HCT 31.4* 27.2* 27.8*  PLT 193 266 228   Coag's No results found for this basename: APTT, INR,  in the last 168 hours BMET  Recent Labs Lab 09/21/14 0930 09/22/14 0410 09/23/14 0400  NA 130* 129* 130*  K 4.9 4.2 3.6*  CL 91* 89* 90*  CO2 BUN 61* 64* 60*  CREATININE 1.74* 1.78* 1.69*  GLUCOSE 175* 118* 129*   Electrolytes  Recent Labs Lab 09/01/2014 1503 09/19/14 0545 09/21/14 0930 09/22/14 0410 09/23/14 0400  CALCIUM  --  9.7 9.1 8.9 8.7  MG 1.3* 1.9  --   --  1.5  PHOS  --   --   --   --  3.7   Sepsis Markers  Recent Labs Lab 09/20/14 1929 09/21/14 0210 09/22/14 0410  PROCALCITON <0.10 <0.10 <0.10   ABG  Recent Labs Lab 09/21/14 1242 09/21/14 1720 09/22/14 0345  PHART 7.351 7.354 7.377  PCO2ART 50.5* 45.4* 43.8  PO2ART 173.0*  96.7 127.0*   Liver Enzymes No results found for this basename: AST, ALT, ALKPHOS, BILITOT, ALBUMIN,  in the last 168 hours Cardiac Enzymes  Recent Labs Lab 09/08/2014 0116 09/01/2014 0905 09/16/2014 1503  TROPONINI <0.30 <0.30 <0.30  PROBNP 6065.0*  --   --    Glucose  Recent Labs Lab 09/22/14 1611 09/22/14 1952 09/22/14 2346 09/23/14 0343 09/23/14 0819 09/23/14 1119  GLUCAP 114* 108* 104* 124* 112* 125*    CXR: mild interstitial edema pattern  ASSESSMENT\PLAN:  PULMONARY Acute hypoxic resp failure>>intubated (9/22) B\L Pulm Edema Mild-mod PAH - 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 Unlikely pneumonia given the acute changes with her respiratory status and CXR findings of pulmonary edema and with bilateral opacities (basilar) developing acutely within 24 hrs.  - cont with lasix as tolerated with close monitoring of her renal fcn  CARDIOLOGY AFRVR Hypotension - resolving  - improved rate control initially with amiodarone gtt, then transition to metoprolol.  Metoprolol held today due to hypotension and patient being on phenylephrine. Heparin gtt stopped and patient started on Eliquis. - cont with phenylephrine to maintain a MAP>65 - TEE possibly in the near future - LV Fcn on TTE could not fully be assessed due to patient being in afib, per report she has moderate LA\RA dilation, along with mod-sev TR regurge. Suspect she has some diastolic dysfunction and will benefit from diuresis and afterload reduction along with afib control.   ID - UTI E.coli (Ur Cx 9/21) on cipro 9/21>>9/24, dc'ed due to cipro resistant organism Rocephin 9/24>>  RENAL - AKI, elevated creatinine - secondary to diuresis, hypotension, poor perfusion - adjusted lasix to  IV daily - gentle fluid boluses if needed - monitor I\O, keep net even for now given need to pressor - baseline sCr~1.1 now 1.69-  con't to monitor closely, avoid hypotension, nephrotoxic drugs, NSAIDs  Heme Anemia - Anemia of Chronic Disease - H\H stable - monitor H\H   Critical Care time - 35 mins  Stephanie Acre, MD  Pulmonary and Critical Care Pager [phone] On Call Pager [phone]

## 2014-09-23 NOTE — Progress Notes (Signed)
MD at bedside to eval pt.  Pt still w/ low RR and low min. volume on weaning trial. Pt to continue on full vent support for now.  RN aware.

## 2014-09-24 LAB — BASIC METABOLIC PANEL
Anion gap: 19 — ABNORMAL HIGH (ref 5–15)
BUN: 56 mg/dL — AB (ref 6–23)
CHLORIDE: 92 meq/L — AB (ref 96–112)
CO2: 24 meq/L (ref 19–32)
CREATININE: 1.58 mg/dL — AB (ref 0.50–1.10)
Calcium: 8.9 mg/dL (ref 8.4–10.5)
GFR calc Af Amer: 33 mL/min — ABNORMAL LOW (ref >90)
GFR calc non Af Amer: 28 mL/min — ABNORMAL LOW (ref >90)
GLUCOSE: 108 mg/dL — AB (ref 70–99)
Potassium: 3.3 mEq/L — ABNORMAL LOW (ref 3.7–5.3)
Sodium: 135 mEq/L — ABNORMAL LOW (ref 137–147)

## 2014-09-24 LAB — CBC
HEMATOCRIT: 27.1 % — AB (ref 36.0–46.0)
HEMOGLOBIN: 9.2 g/dL — AB (ref 12.0–15.0)
MCH: 28.9 pg (ref 26.0–34.0)
MCHC: 33.9 g/dL (ref 30.0–36.0)
MCV: 85.2 fL (ref 78.0–100.0)
Platelets: 216 10*3/uL (ref 150–400)
RBC: 3.18 MIL/uL — AB (ref 3.87–5.11)
RDW: 14.6 % (ref 11.5–15.5)
WBC: 8.7 10*3/uL (ref 4.0–10.5)

## 2014-09-24 LAB — GLUCOSE, CAPILLARY
GLUCOSE-CAPILLARY: 100 mg/dL — AB (ref 70–99)
Glucose-Capillary: 138 mg/dL — ABNORMAL HIGH (ref 70–99)
Glucose-Capillary: 193 mg/dL — ABNORMAL HIGH (ref 70–99)

## 2014-09-24 MED ORDER — MIDAZOLAM BOLUS VIA INFUSION
5.0000 mg | INTRAVENOUS | Status: DC | PRN
  Filled 2014-09-24: qty 20

## 2014-09-24 MED ORDER — METOPROLOL TARTRATE 1 MG/ML IV SOLN
5.0000 mg | Freq: Once | INTRAVENOUS | Status: AC
  Administered 2014-09-24: 5 mg via INTRAVENOUS

## 2014-09-24 MED ORDER — CHLORHEXIDINE GLUCONATE 0.12 % MT SOLN: 15.0000 mL | Freq: Two times a day (BID) | OROMUCOSAL | Status: DC

## 2014-09-24 MED ORDER — MORPHINE BOLUS VIA INFUSION
5.0000 mg | INTRAVENOUS | Status: DC | PRN
  Filled 2014-09-24: qty 20

## 2014-09-24 MED ORDER — POTASSIUM CHLORIDE 10 MEQ/50ML IV SOLN
10.0000 meq | INTRAVENOUS | Status: AC
  Administered 2014-09-24 (×4): 10 meq via INTRAVENOUS
  Filled 2014-09-24: qty 50

## 2014-09-24 MED ORDER — METOPROLOL TARTRATE 1 MG/ML IV SOLN
5.0000 mg | Freq: Once | INTRAVENOUS | Status: AC
  Administered 2014-09-24: 5 mg via INTRAVENOUS
  Filled 2014-09-24: qty 5

## 2014-09-24 MED ORDER — CETYLPYRIDINIUM CHLORIDE 0.05 % MT LIQD
7.0000 mL | Freq: Two times a day (BID) | OROMUCOSAL | Status: DC
  Administered 2014-09-24: 7 mL via OROMUCOSAL

## 2014-09-24 MED ORDER — MORPHINE SULFATE 2 MG/ML IJ SOLN
2.0000 mg | Freq: Once | INTRAMUSCULAR | Status: AC
  Administered 2014-09-24: 2 mg via INTRAVENOUS
  Filled 2014-09-24: qty 1

## 2014-09-24 MED ORDER — MORPHINE SULFATE 2 MG/ML IJ SOLN
INTRAMUSCULAR | Status: AC
  Filled 2014-09-24: qty 1

## 2014-09-24 MED ORDER — MAGNESIUM SULFATE IN D5W 10-5 MG/ML-% IV SOLN
1.0000 g | Freq: Once | INTRAVENOUS | Status: AC
  Administered 2014-09-24: 1 g via INTRAVENOUS
  Filled 2014-09-24 (×2): qty 100

## 2014-09-24 MED ORDER — SODIUM CHLORIDE 0.9 % IV SOLN
10.0000 mg/h | INTRAVENOUS | Status: DC
  Filled 2014-09-24: qty 25

## 2014-09-24 MED ORDER — METOPROLOL TARTRATE 1 MG/ML IV SOLN
2.5000 mg | INTRAVENOUS | Status: DC | PRN
  Administered 2014-09-24: 2.5 mg via INTRAVENOUS
  Filled 2014-09-24 (×2): qty 5

## 2014-09-24 MED ORDER — HYDRALAZINE HCL 20 MG/ML IJ SOLN
10.0000 mg | INTRAMUSCULAR | Status: DC | PRN
  Administered 2014-09-24: 10 mg via INTRAVENOUS
  Filled 2014-09-24 (×2): qty 1

## 2014-09-24 MED ORDER — METOPROLOL TARTRATE 1 MG/ML IV SOLN
5.0000 mg | Freq: Four times a day (QID) | INTRAVENOUS | Status: DC
  Administered 2014-09-24: 5 mg via INTRAVENOUS
  Filled 2014-09-24: qty 5

## 2014-09-24 MED ORDER — MORPHINE SULFATE 10 MG/ML IJ SOLN
10.0000 mg/h | INTRAMUSCULAR | Status: DC
  Administered 2014-09-24: 15 mg/h via INTRAVENOUS
  Administered 2014-09-24: 5 mg/h via INTRAVENOUS
  Filled 2014-09-24 (×2): qty 10

## 2014-09-24 MED ORDER — MORPHINE SULFATE 2 MG/ML IJ SOLN: 1.0000 mg | Freq: Once | INTRAMUSCULAR | Status: AC

## 2014-09-24 MED ORDER — SODIUM CHLORIDE 0.9 % IV SOLN
10.0000 mg/h | INTRAVENOUS | Status: DC
  Administered 2014-09-24: 12 mg/h via INTRAVENOUS
  Administered 2014-09-24: 2 mg/h via INTRAVENOUS
  Administered 2014-09-24: 15 mg/h via INTRAVENOUS
  Filled 2014-09-24 (×3): qty 10

## 2014-09-24 NOTE — Progress Notes (Signed)
Physical Therapy Discharge Patient Details Name: Natalie Hicks MRN: [id number] DOB: 1925-01-17 Today's Date: 09/02/2014 Time:  -     Patient discharged from PT services secondary to medical decline - noted decline in respiratory status, no plans to reintubate, with plans for comfort care.  Please see latest therapy progress note for current level of functioning and progress toward goals.    Progress and discharge plan discussed with patient and/or caregiver: caregiver unavailable; MD/family have made decision for comfort care with no reintubation  GP     Theda Payer 09/12/2014, 2:52 PM  Pager [phone]

## 2014-09-24 NOTE — Progress Notes (Signed)
1830 Versed drip added per MD order for pt comfort. Family at bedside. Comfort measures ongoing.

## 2014-09-24 NOTE — Progress Notes (Signed)
Changed pt back to full vent support d/t low Rate alarming several times.  RN aware, no distress noted.  Sat 98%

## 2014-09-24 NOTE — Progress Notes (Signed)
09/27/2014 1500  Clinical Encounter Type  Visited With Patient and family together;Health care provider  Visit Type Initial  Referral From Physician  Spiritual Encounters  Spiritual Needs Prayer;Ritual;Emotional   Chaplain visited with patient. Chaplain was referred to patient via Spiritual Care consult. Patient was also being visited by her son and her priest. Patient's priest identified as Catering manager. Patient was able to respond to voices in the room and occasionally uttered a word or two in response. Patient has strong spiritual support from her priest who visited for a generous amount of time, interacted with her, and prayed over her before he left. Patient also has many more family members on the way to visit her. Chaplain offered dialogue to the priest and the son who were present. Chaplain will continue to offer emotional and spiritual support for patient as needed. Cranston Neighbor, Chaplain 3:25 PM

## 2014-09-24 NOTE — Progress Notes (Signed)
OT Cancellation Note  Patient Details Name: Natalie Hicks MRN: [id number] DOB: March 11, 1925   Cancelled Treatment:    Reason Eval/Treat Not Completed: Patient not medically ready. Plan is  to extubate this morning. Will continue to follow.  Evern Bio October 21, 2014, 8:26 AM [phone]

## 2014-09-24 NOTE — Progress Notes (Signed)
Update: Patient extubated this morning. In the last 20 mins with worsening respiratory status (tachypnea, wheezing), altered, tachycardia (irregular, 120-140s) and with elevated blood pressure (sbp in the 170-190s).  Gave Lopressor, hydralazine, with improvement in blood pressure and heart, but still with tenuous respiratory status.   Son, Rokia Bosket [facility]), at bedside, stated that she did not want cardioversion for her afib, and now she would not want to be reintubated. Decision made for comfort care.   CODE: [id number] Comfort care orders entered.   Stephanie Acre, MD [facility] Pager [phone] On Call Pager [phone]

## 2014-09-24 NOTE — Progress Notes (Signed)
 Fentanyl wasted in sink, Surveyor, mining as witness. Delbert Harness 11:53 AM 09/02/2014

## 2014-09-24 NOTE — Procedures (Signed)
Extubation Procedure Note  Patient Details:   Name: Natalie Hicks DOB: November 04, 1925 MRN: [id number]   Airway Documentation:    + cuff leak prior to extubation.  Evaluation  O2 sats: stable throughout Complications: No apparent complications Patient did tolerate procedure well. Bilateral Breath Sounds: Diminished;Clear Suctioning: Oral;Airway Yes, pt able to speak.  No stridor noted.  No resp distress noted. Sat 96-100% on 4 lpm [place].  RN at bedside  Jennette Kettle 10-16-2014, 11:43 AM

## 2014-09-24 NOTE — Progress Notes (Signed)
1330 pt noted to be in increased respiratory distress with shallow breathing and moaning. BP in 170s-180s. Comfort measures provided, cardiology notified, BP medicine ordered and given. CCM and respiratory called to assess patient at bedside. Code status addressed with son, Herlinda Heady, due to patient deterioration. Comfort measures ordered. Morphine give. Hospital chaplain and patient priest at bedside. Compassion cart ordered. E link notified.  Leda Gauze E 3:30 PM 09/08/2014

## 2014-09-24 NOTE — Progress Notes (Signed)
PULMONARY / CRITICAL CARE MEDICINE   Name: Natalie Hicks MRN: [id number] DOB: 21-Nov-1925    ADMISSION DATE:  09/11/2014   INITIAL PRESENTATION:  4F admitted to [facility] service 9/19 with new onset AF with RVR and dyspnea due to CHF(acute diastolic). Transferred to ICU/SDU 9/20 with increased SOB. Did not require vent support  STUDIES:  9/19 TTE: LVEF 50-55%. LA moderately dilated. RA mildly dilated. PASP est 52 mmHg 9/22 CXR: new bilateral basilar opacities SIGNIFICANT EVENTS: 9/22>>worsening respiratory status, more lethargic>>intubated   HISTORY OF PRESENT ILLNESS:   Admission history reviewed. Transferred to ICU/SDU due to lethargy and abnormal ABG with intention of implementing NPPV. However, on arrival, pt was fully alert and in no respiratory distress. She denies dyspnea and chest pain  Subjective: Mild agitation this morning off sedation, but with overall improvement. CXR show pulm edema improvement. Plan for sedation holiday and possible extubation in the next 1-2 days.  Cardiology following, still with A. Fib, but rate control at this time, therefore no emergent need for TTE, per cardiology. Son at bedside updated on treatment and management plan.    VITAL SIGNS: Temp:  [97.8 F (36.6 C)-98.7 F (37.1 C)] 97.8 F (36.6 C) (09/25 0326) Pulse Rate:  [35-123] 110 (09/25 0800) Resp:  [11-29] 22 (09/25 0800) BP: (98-164)/(36-96) 132/75 mmHg (09/25 0800) SpO2:  [95 %-100 %] 98 % (09/25 0800) Arterial Line BP: (102-187)/(45-79) 187/79 mmHg (09/25 0800) FiO2 (%):  [40 %] 40 % (09/25 0726) Weight:  [173 lb 11.6 oz (78.8 kg)] 173 lb 11.6 oz (78.8 kg) (09/25 0424) Vent Mode:  [-] PSV;CPAP FiO2 (%):  [40 %] 40 % Set Rate:  [16 bmp] 16 bmp Vt Set:  [420 mL] 420 mL PEEP:  [5 cmH20] 5 cmH20 Pressure Support:  [5 cmH20-15 cmH20] 5 cmH20 Plateau Pressure:  [18 cmH20-24 cmH20] 20 cmH20 INTAKE / OUTPUT:  Intake/Output Summary (Last 24 hours) at 09/02/2014 4098 Last data filed at  09/16/2014 0800  Gross per 24 hour  Intake  802.1 ml  Output   2145 ml  Net -1342.9 ml    PHYSICAL EXAMINATION: General:  Pleasant, NAD, RASS 0 Neuro:  No focal deficits HEENT:  Normal Cardiovascular: IRIR, no Murmur, S1, S2 Lungs:  Good airway entry, coarse upper airway sounds (intubated), mild fine basilar crackles.  Abdomen:  Soft, NT, +BS Ext: warm, mild +1pitting edema.   LABS:  CBC  Recent Labs Lab 09/22/14 0410 09/23/14 0400 09/08/2014 0435  WBC 11.4* 9.0 8.7  HGB 9.0* 9.1* 9.2*  HCT 27.2* 27.8* 27.1*  PLT 266 228 216   Coag's No results found for this basename: APTT, INR,  in the last 168 hours BMET  Recent Labs Lab 09/22/14 0410 09/23/14 0400 09/27/2014 0435  NA 129* 130* 135*  K 4.2 3.6* 3.3*  CL 89* 90* 92*  CO2 BUN 64* 60* 56*  CREATININE 1.78* 1.69* 1.58*  GLUCOSE 118* 129* 108*   Electrolytes  Recent Labs Lab 09/13/2014 1503 09/19/14 0545  09/22/14 0410 09/23/14 0400 09/23/2014 0435  CALCIUM  --  9.7  < > 8.9 8.7 8.9  MG 1.3* 1.9  --   --  1.5  --   PHOS  --   --   --   --  3.7  --   < > = values in this interval not displayed. Sepsis Markers  Recent Labs Lab 09/20/14 1929 09/21/14 0210 09/22/14 0410  PROCALCITON <0.10 <0.10 <0.10   ABG  Recent Labs  Lab 09/21/14 1242 09/21/14 1720 09/22/14 0345  PHART 7.351 7.354 7.377  PCO2ART 50.5* 45.4* 43.8  PO2ART 173.0* 96.7 127.0*   Liver Enzymes No results found for this basename: AST, ALT, ALKPHOS, BILITOT, ALBUMIN,  in the last 168 hours Cardiac Enzymes  Recent Labs Lab 09/21/2014 0116 09/07/2014 0905 09/12/2014 1503  TROPONINI <0.30 <0.30 <0.30  PROBNP 6065.0*  --   --    Glucose  Recent Labs Lab 09/23/14 1119 09/23/14 1533 09/23/14 1957 09/23/14 2342 09/14/2014 0341 09/06/2014 0759  GLUCAP 125* 111* 129* 106* 100* 138*    CXR: mild interstitial edema pattern  ASSESSMENT\PLAN:  PULMONARY Acute hypoxic resp failure>>intubated (9/22) B\L Pulm Edema Mild-mod  PAH - likely post-capillary hypertension  Continue to monitor in the ICU - intubated\sedated --> plan for extubation today - cont with MV and wean as tolerated - still with afib but now rate controlled, no TEE per cardiology - optimizing her card function will paramount to her respiratory status, appreciate cardiology input - bibasilar opacities with pulmonary edema - most likely atelectasis in combination with flash pulm edema (acute onset).  - cont with lasix as tolerated with close monitoring of her renal fcn  CARDIOLOGY AFRVR LIJ CVL 9/22>> L Radial Aline 9/22>> Hypotension - resolving  - improved rate control initially with amiodarone gtt, then transition to metoprolol.  Heparin gtt stopped and patient started on Eliquis. - TEE possibly in the near future as an outpatient  - LV Fcn on TTE could not fully be assessed due to patient being in afib, per report she has moderate LA\RA dilation, along with mod-sev TR regurge, EF~50-55%. Suspect she has some diastolic dysfunction and will benefit from diuresis and afterload reduction along with afib control.   ID - UTI E.coli (Ur Cx 9/21) on cipro 9/21>>9/24, dc'ed due to cipro resistant organism Rocephin 9/24>>  RENAL - AKI, elevated creatinine - secondary to diuresis, hypotension, poor perfusion - adjusted lasix to  IV daily - gentle fluid boluses if needed - monitor I\O, keep net even for now given need to pressor - baseline sCr~1.1 now 1.58 -  con't to monitor closely, avoid hypotension, nephrotoxic drugs, NSAIDs  Heme Anemia - Anemia of Chronic Disease - H\H stable - monitor H\H   TODAY SUMMARY: 35F admitted to [facility] service 9/19 with new onset AF with RVR and dyspnea due to CHF(acute diastolic).  Plan for extubation, adjust BP\Afib med per cardiology.    Critical Care time - 35 mins  Stephanie Acre, MD [place] [facility] Pager [phone] On Call Pager [phone]

## 2014-09-24 NOTE — Progress Notes (Addendum)
CARDIOLOGY CONSULT NOTE   Patient ID: Natalie Hicks MRN: [id number], DOB/AGE: [birth year]   Admit date: 09/08/2014 Date of Consult: 09/03/2014  Primary Physician: Lupita Raider, MD Primary Cardiologist: new to  - seen by Katherina Right, MD  Pt. Profile  80 y/o female without prior cardiac hx, who presented to the ED on 9/18 with dyspnea.   She was found to have a-fib with RVR.  She was transferred to the CCU several days ago for increased respiratory distress - had a respiratory acidosis.   She was intubated yesterday. Her HR has been well controlled.  HR has increased a bit this am due to aggitation from being on the vent.   Problem List  Past Medical History  Diagnosis Date  . Diabetes   . Chronic kidney disease   . Hypertension   . A-fib     a. Dx 08/2014, CHA2DS2VASc = 5;  b. 08/2014 Echo: EF 50-55%, mod MR, mod dil LA, mod-sev TR, PASP .Marland Kitchen    Past Surgical History  Procedure Laterality Date  . Tonsillectomy    . Hemrhoidectomy      Allergies  Allergies  Allergen Reactions  . Actos [Pioglitazone] Anaphylaxis  . Avandia [Rosiglitazone] Shortness Of Breath  . Aspirin Other (See Comments)    Reaction: cramping and GI upset  . Celecoxib Other (See Comments)    Reaction: GI upset  . Penicillins Hives     80 y/o female with the above problem list.  She does not have a prior cardiac hx.  She also has a h/o chronic cough and was treated w/ outpt abx in June.  At baseline, she lives by herself in assisted living and generally uses a walker to get around.  She has no h/o falls and tries to walk around her building 4x/day.  She has had worsening dyspnea with CO2 retention while up on 3E.  Was transferred to CCU.    Inpatient Medications  . antiseptic oral rinse  7 mL Mouth Rinse QID  . apixaban  2.5 mg Per Tube BID  . cefTRIAXone (ROCEPHIN)  IV  1 g Intravenous Q24H  . chlorhexidine  15 mL Mouth Rinse BID  . cholecalciferol  1,000 Units Oral QPC lunch  .  famotidine (PEPCID) IV  20 mg Intravenous Q24H  . furosemide  20 mg Intravenous Daily  . insulin aspart  2-6 Units Subcutaneous 6 times per day  . ipratropium  0.5 mg Nebulization TID  . levalbuterol  0.63 mg Nebulization TID  . magnesium sulfate 1 - 4 g bolus IVPB  1 g Intravenous Once  . metoprolol  5 mg Intravenous 4 times per day  . multivitamin  5 mL Oral Daily  . potassium chloride  10 mEq Intravenous Q1 Hr x 4  . sodium chloride  3 mL Intravenous Q12H  . timolol  1 drop Both Eyes QHS   Family History Family History  Problem Relation Age of Onset  . Hypertension Mother     Social History History   Social History  . Marital Status: Divorced    Spouse Name: N/A    Number of Children: N/A  . Years of Education: N/A   Occupational History  . Not on file.   Social History Main Topics  . Smoking status: Former Games developer  . Smokeless tobacco: Not on file  . Alcohol Use: No  . Drug Use: No  . Sexual Activity: Not on file   Other Topics Concern  . Not on file  Social History Narrative   Lives in Assisted Living.      Physical Exam  Blood pressure 128/64, pulse 88, temperature 97.8 F (36.6 C), temperature source Oral, resp. rate 16, height  (1.575 m), weight 173 lb 11.6 oz (78.8 kg), SpO2 98.00%.  General: on the vent. Being weaned.  Still needs occasional fentanyl  Psych:  Neuro:   HEENT: Normal  Neck: Supple without bruits or JVD. Lungs:  Rales on right base.    Heart: IR, IR, distant, no s3, s4, or murmurs. Abdomen: Soft, non-tender, non-distended, BS + x 4.  Extremities: trace edema. DP/PT/Radials 2+ and equal bilaterally.  Labs  No results found for this basename: CKTOTAL, CKMB, TROPONINI,  in the last 72 hours Lab Results  Component Value Date   WBC 8.7 09/15/2014   HGB 9.2* 09/23/2014   HCT 27.1* 09/22/2014   MCV 85.2 09/21/2014   PLT 216 09/15/2014     Recent Labs Lab 09/22/2014 0435  NA 135*  K 3.3*  CL 92*  CO2 24  BUN 56*  CREATININE  1.58*  CALCIUM 8.9  GLUCOSE 108*   Radiology/Studies  Dg Chest 2 View  09/17/2014   CLINICAL DATA:  Cough, shortness of Breath  EXAM: CHEST  2 VIEW  COMPARISON:  06/16/2014  FINDINGS: Cardiomegaly is noted. No acute infiltrate or pulmonary edema. Stable chronic interstitial prominence. Osteopenia and degenerative changes thoracic spine. Prior vertebroplasty lumbar spine.  IMPRESSION: No infiltrate or pulmonary edema. Cardiomegaly. Chronic interstitial prominence.   Electronically Signed   By: Natasha Mead M.D.   On: 09/17/2014 16:25   2D Echocardiogram 09-19-2014  Study Conclusions  - Left ventricle: The cavity size was normal. There was mild focal   basal hypertrophy of the septum. Systolic function was normal.   The estimated ejection fraction was in the range of 50% to 55%.   Regional wall motion abnormalities cannot be excluded. - Ventricular septum: Septal motion showed paradox. - Mitral valve: Calcified annulus. Moderately thickened, mildly   calcified leaflets . There was moderate regurgitation. - Left atrium: The atrium was moderately dilated. - Right atrium: The atrium was mildly dilated. - Tricuspid valve: There was moderate-severe regurgitation. - Pulmonary arteries: PA peak pressure: 52 mm Hg (S). _____________   ECG  Afib, 90, lbbb  ASSESSMENT AND PLAN  1. Afib RVR:  Her HR is clearly better on metoprolol.  We gave her IV metoprolol this am . She still refuses cardioversion and TEE. Her son came to talk to her and still she refuses the procedures. At this point, her HR and BP are fairly stable and we should be able to proceed with extubation.  Continue Eliquis Titrate metoprolol as needed.  DC iv amio    2.  HTN:  Better on IV metoprolol      3.  CKD III:  Stable.  Creat < 1.5.   Vesta Mixer, Montez Hageman., MD, [facility] 09/05/2014, 11:06 AM [address],  [address] Office [phone] Pager [phone]

## 2014-09-24 NOTE — Progress Notes (Signed)
1515 Morphine drip started per order and per protocol, Crystal Rice RN as witness. Comfort measures ongoing for patient and family at bedside. Delbert Harness 09/17/2014 3:32 PM

## 2014-09-30 NOTE — Discharge Summary (Signed)
Death Summary  Name: Natalie Hicks  MRN: [id number]  DOB: Jun 03, 1925   ADMISSION DATE: 09/15/2014  STUDIES:  9/19 TTE: LVEF 50-55%. LA moderately dilated. RA mildly dilated. PASP est 52 mmHg  9/22 CXR: new bilateral basilar opacities  SIGNIFICANT EVENTS:  9/20>>increased SOB, transferred to ICU, refusing to wearing non rebreather or Bipap, refused TEE cardioversion of Atrial fibrillation 9/22>>worsening respiratory status, more lethargic>>intubated 9/25>>extubated, elevated blood pressure, refused Bipap, refused reintubation, became more lethargic, made comfort care.      HISTORY\HOSPITAL COURSE: Natalie Hicks is a 80 y.o. Caucasian female with history of hypertension, chronic kidney disease stage III, diabetes, urinary incontinence, and chronic back pain who presents with the shortness of breath on 09/19/19. Patient reports that her symptoms started about 2 days ago when she noted that she was gradually becoming more short of breath. She did report that she had cough which was productive for mucus. Due to her ongoing shortness of breath, she presented to the emergency department after having dinner the previous night. She denies any recent fevers, chills, vomiting, chest pain, abdominal pain, diarrhea, headaches or vision changes. She did admit to being nauseated but has not vomited. When patient presented to [facility] she was found to be in atrial fibrillation with rapid ventricular rate. She was given a diltiazem bolus which controlled her heart rate but also made her hypotensive. She was given 2 doses of calcium gluconate and eventually her blood pressure improved. Hospitalist service was asked to admit the patient for further care and management and she was admitted to [facility] under the [facility] service on 09/17/2014.  On September 20 she was noted to have increasing dyspnea with atrial fibrillation with RVR and was transferred to the ICU service.  Throughout her  hospital course she was seen and followed closely by cardiology and critical care service, she was started on diltiazem drip to control the atrial fibrillation, however her blood pressure was noted to be low at which point he was switched amiodarone drip. Throughout hospital course she was offered BiPAP on many occasions but she refused the BiPAP mask on, she was also offered a nonrebreather mask which she also refused. Son, who is the health care power of attorney, tried to reason with her to wear either a nonrebreather mask or BiPAP but she refused both. She was rate controlled on the above interventions for her A. fib and noted to be stable from a cardiac standpoint, she was offered TEE with possible cardioversion but refused to have the procedure done. On September 22 patient was noted to have worsening respiratory distress, elevated blood pressure, becoming more lethargic, her son was contacted and updated on her clinical status and requested intubation, which was done. On September 25 patient was noted to be stable for extubation , she was reaching for the tube and gesturing to have it removed, which it was; again she was offered TEE with possible cardioversion and she refused. Her son brought her living will which stated she did not want any prolonged life saving interventions or measures. Throughout the day on September 25 her blood pressure continued to increase even in the presence of intermittent doses of hydralazine and metoprolol, again she was becoming lethargic and going into respiratory failure, intubation was suggested which the patient refused, patient's son was at the bedside and decided that in the current condition his mother is in she would not want another intubation; he decided on comfort care measures which were instituted  at that time.    Date of Death: 10-07-14 Time of [id number]  FINAL DIAGNOSIS: Atrial fibrillation with RVR Bilateral pulmonary edema Acute Respiratory  failure Stage III CKD Hypertension Urinary tract infection-Escherichia coli Acute kidney injury Elevated creatinine Anemia of chronic disease Diabetes without complications   Discharge time - 40 minutes   Stephanie Acre, MD [place] [facility] Pager [phone] On Call Pager [phone]

## 2014-09-30 NOTE — Progress Notes (Signed)
Morphine 25 ml and versed 10 ml wasted. Adelina Mings RN witnessed waste.

## 2014-09-30 DEATH — deceased
